# Patient Record
Sex: Male | Born: 1945
Health system: Southern US, Community
[De-identification: ages and names within clinical notes are randomized; demographics above are authoritative.]

## PROBLEM LIST (undated history)

## (undated) DIAGNOSIS — N201 Calculus of ureter: Secondary | ICD-10-CM

## (undated) DIAGNOSIS — Z85828 Personal history of other malignant neoplasm of skin: Secondary | ICD-10-CM

## (undated) DIAGNOSIS — G629 Polyneuropathy, unspecified: Secondary | ICD-10-CM

## (undated) DIAGNOSIS — Z972 Presence of dental prosthetic device (complete) (partial): Secondary | ICD-10-CM

## (undated) DIAGNOSIS — Z951 Presence of aortocoronary bypass graft: Secondary | ICD-10-CM

## (undated) DIAGNOSIS — E119 Type 2 diabetes mellitus without complications: Secondary | ICD-10-CM

## (undated) DIAGNOSIS — Z87442 Personal history of urinary calculi: Secondary | ICD-10-CM

## (undated) DIAGNOSIS — R3915 Urgency of urination: Secondary | ICD-10-CM

## (undated) DIAGNOSIS — A498 Other bacterial infections of unspecified site: Secondary | ICD-10-CM

## (undated) DIAGNOSIS — I2510 Atherosclerotic heart disease of native coronary artery without angina pectoris: Secondary | ICD-10-CM

## (undated) HISTORY — PX: CARDIAC CATHETERIZATION: SHX172

## (undated) HISTORY — DX: Polyneuropathy, unspecified: G62.9

## (undated) HISTORY — PX: OTHER SURGICAL HISTORY: SHX169

---

## 1992-12-11 HISTORY — PX: KNEE ARTHROSCOPY: SUR90

## 1998-03-23 ENCOUNTER — Other Ambulatory Visit: Admission: RE | Admit: 1998-03-23 | Discharge: 1998-03-23 | Payer: Self-pay | Admitting: *Deleted

## 1999-04-05 ENCOUNTER — Other Ambulatory Visit: Admission: RE | Admit: 1999-04-05 | Discharge: 1999-04-05 | Payer: Self-pay | Admitting: Podiatry

## 2004-02-09 ENCOUNTER — Ambulatory Visit (HOSPITAL_COMMUNITY): Admission: RE | Admit: 2004-02-09 | Discharge: 2004-02-09 | Payer: Self-pay | Admitting: Family Medicine

## 2006-10-23 ENCOUNTER — Encounter (INDEPENDENT_AMBULATORY_CARE_PROVIDER_SITE_OTHER): Payer: Self-pay | Admitting: Specialist

## 2006-10-23 ENCOUNTER — Inpatient Hospital Stay (HOSPITAL_COMMUNITY): Admission: RE | Admit: 2006-10-23 | Discharge: 2006-10-24 | Payer: Self-pay | Admitting: Urology

## 2006-10-23 HISTORY — PX: OTHER SURGICAL HISTORY: SHX169

## 2007-09-16 ENCOUNTER — Observation Stay (HOSPITAL_COMMUNITY): Admission: AD | Admit: 2007-09-16 | Discharge: 2007-09-17 | Payer: Self-pay | Admitting: Urology

## 2009-07-02 ENCOUNTER — Ambulatory Visit (HOSPITAL_BASED_OUTPATIENT_CLINIC_OR_DEPARTMENT_OTHER): Admission: RE | Admit: 2009-07-02 | Discharge: 2009-07-02 | Payer: Self-pay | Admitting: Urology

## 2011-03-19 LAB — POCT I-STAT, CHEM 8
BUN: 19 mg/dL (ref 6–23)
Chloride: 104 mEq/L (ref 96–112)
Creatinine, Ser: 1.7 mg/dL — ABNORMAL HIGH (ref 0.4–1.5)
Sodium: 137 mEq/L (ref 135–145)
TCO2: 25 mmol/L (ref 0–100)

## 2011-03-19 LAB — GLUCOSE, CAPILLARY: Glucose-Capillary: 136 mg/dL — ABNORMAL HIGH (ref 70–99)

## 2011-04-25 NOTE — Op Note (Signed)
NAME:  [name], [name] NO.:  [id number]   MEDICAL RECORD NO.:  [id number]          PATIENT TYPE:  AMB   LOCATION:  DAY                          FACILITY:  [facility]   PHYSICIAN:  Bertram Millard. Dahlstedt, M.D.DATE OF BIRTH:  07-30-1946   DATE OF PROCEDURE:  09/16/2007  DATE OF DISCHARGE:                               OPERATIVE REPORT   PREOPERATIVE DIAGNOSIS:  Right ureteral stone.   POSTOPERATIVE DIAGNOSIS:  Right ureteral stone.   PRINCIPAL PROCEDURE:  Cysto, right retrograde ureteropyelogram, holmium  laser of right ureteral stone, right ureteroscopic stone extraction,  right double-J stent.   SURGEON:  Bertram Millard. Dahlstedt, M.D.   ANESTHESIA:  General LMA.   COMPLICATIONS:  None.   BRIEF HISTORY:  A 65 year old who presented to the office today with  significant right flank pain.  CT revealed significant extravasation of  urine around the kidney and right hydroureter.  Due to the patient's  pain and his other findings, it was recommended that he undergo right  ureteroscopic stone extraction with possible stent.  Risks and  complications of this procedure were discussed with the patient.  He  understands these and desires to proceed.   DESCRIPTION OF PROCEDURE:  The patient was administered preoperative IV  antibiotics and taken to the operating room where general anesthetic was  administered.  He was placed in dorsal lithotomy position.  Genitalia  and perineum were prepped and draped.  The 22-French panendoscope was  advanced into his bladder.  Prostatic urethra was open, status post TUR.  Right ureteral orifice was identified and cannulated with an open-end  catheter.  Retrograde revealed a large filling defect in the distal  ureter with proximal hydroureter.  The guidewire was advanced up into  the kidney.  The distal ureter was dilated to 15-French with ureteral  access catheter.  I then attempted to grasp the stone using the  ureteroscope and a nitinol  basket.  I was able to grasp it, but unable  to extract it due to its large size.  I then release a stone, and passed  the laser fiber up.  This stone was fragmented into multiple small  particles, mostly sand-like fragments.  The larger two or three  fragments were grasped and removed into the bladder.  At this point the  large majority of the stone was felt to have been fragmented and brought  in the bladder.  Over top of the guidewire, a 6-French x 24 cm contour  double-J stent was placed.  Good curls were seen proximally  and distally.  The string was left on and brought through the urethra  and taped to the penis.  Bladder was drained.  The scope removed.   The patient tolerated procedure well.  He was awakened, taken to PACU in  stable condition.      Bertram Millard. Dahlstedt, M.D.  Electronically Signed     SMD/MEDQ  D:  09/16/2007  T:  09/17/2007  Job:  [id number]

## 2011-04-25 NOTE — Op Note (Signed)
NAME:  [name], [name] NO.:  [id number]   MEDICAL RECORD NO.:  [id number]          PATIENT TYPE:  AMB   LOCATION:  [facility]                         FACILITY:  [facility]   PHYSICIAN:  Bertram Millard. Dahlstedt, M.D.DATE OF BIRTH:  1946-10-12   DATE OF PROCEDURE:  07/02/2009  DATE OF DISCHARGE:                               OPERATIVE REPORT   PREOPERATIVE DIAGNOSIS:  Large left ureteral stone.   POSTOPERATIVE DIAGNOSIS:  Large left ureteral stone.   PROCEDURE:  Cystoscopy, left ureteroscopy, holmium laser of the ureteral  stone, double-J stent placement.   SURGEON:  Bertram Millard. Dahlstedt, M.D.   ANESTHESIA:  General with LMA.   COMPLICATIONS:  None.   BRIEF HISTORY:  This 65 year old male presented earlier this week with  left flank pain.  He has a prior history of kidney stones and  intervention for this.  He was sent home on pain medicine, presented  today with 3 days of nausea, vomiting and significant pain.  He was made  pain free in the office, but KUB was performed showing a probable large  stone overlying his left sacral wing.  This was approximately 6 x 14 mm.  It was recommended to perform ureteroscopy and laser lithotripsy, due to  his pain and inability to keep food and fluids down.  Risks and  complications have been discussed with the patient.  He understands  these and agrees to proceed.   DESCRIPTION OF PROCEDURE:  The patient was identified in the holding  area, the surgical side marked, he received preoperative IV antibiotics.  He was taken to the operating room where general anesthetic was  administered using LMA.  He was placed in the dorsal lithotomy position.  Genitalia and perineum were prepped and draped.  A 22-French  panendoscope was advanced through his urethra into his bladder which was  found to be normal.  Left ureteral orifice was cannulated with a sensor  tip guidewire which was advanced using fluoroscopic guidance into the  left kidney.   The cystoscope was then removed with a guidewire left in  place.  A 6-French short ureteroscope was then passed under direct  vision up to the stone which was approximately 5 cm up the ureter.  This  was fairly large.  Pictures were that were obtained.  The 200 micron  laser fiber was advanced up to the stone, and the stone treated with the  holmium laser.  It was fragmented into hundreds of smaller fragments  which were felt to be easily able to be passed.  None were extracted.  The largest of these fragments was probably 1-2 mm.  The ureter was  passively dilated with the scope.  I then remove the ureteroscope after  I passed the ureteroscope proximally up the ureter were no stones were  seen.  Over the cystoscope a 24 cm x 6-French Contour stent was placed  in the left ureter using fluoroscopic and cystoscopic guidance.  Good  curls were seen proximally and distally.  The string was left intact.  The bladder was drained and the string brought through the urethra once  the scope was removed.   The patient tolerated procedure well.  He is discharged on Percocet and  Bactrim one p.o. daily for 6 days.  He will follow-up on the 29th of  this month.  He will remove the stent approximately 3 days.      Bertram Millard. Dahlstedt, M.D.  Electronically Signed     SMD/MEDQ  D:  07/02/2009  T:  07/03/2009  Job:  [id number]

## 2011-04-28 NOTE — Op Note (Signed)
NAME:  [name], [name] NO.:  [id number]   MEDICAL RECORD NO.:  [id number]          PATIENT TYPE:  INP   LOCATION:  0007                         FACILITY:  [facility]   PHYSICIAN:  Bertram Millard. Dahlstedt, M.D.DATE OF BIRTH:  [birth year]   DATE OF PROCEDURE:  10/23/2006  DATE OF DISCHARGE:                                 OPERATIVE REPORT   PREOPERATIVE DIAGNOSES:  1. Large bladder calculus.  2. Benign prostatic hypertrophy.   POSTOPERATIVE DIAGNOSES:  1. Large bladder calculus.  2. Benign prostatic hypertrophy.   PRINCIPAL PROCEDURE:  1. Cystolithalopaxy with holmium laser.  2. Transurethral incision of prostate.   SURGEON:  Bertram Millard. Dahlstedt, M.D.   ANESTHESIA:  General with LMA.   COMPLICATIONS:  None.   SPECIMEN:  Prostate chips.   BRIEF HISTORY:  This 65 year old gentleman presented about 2 weeks ago with  gross hematuria, usually after exercise.  He does have mild to moderate  lower urinary tract symptomatology.  He has a history of urolithiasis.  He  has seen Dr. Wanda Plump in the past for this.  The patient had a CT urogram  performed by Dr. Abigail Miyamoto which revealed a greater than 2 cm bladder calculus  as well as 2 stones in the right kidney, 1 small stone on the left.   The patient asked me not the scope him in the office, as he had significant  problems in the office with that before.  I reviewed the patient's CT scan.  This did reveal a 2.5 cm stone in his bladder.   It was recommended that we perform cystolithalopaxy, possible TURP versus  TUIP (as the patient does not or cannot obviously pass smaller stones).   Risks and complications of these procedures have been discussed with the  patient.  He understands these and desires to proceed.   DESCRIPTION OF PROCEDURE:  The patient received preoperative IV antibiotics.  He was taken to the operating room where general anesthetic was administered  using LMA.  He is placed in the dorsal lithotomy  position.  Genitalia and  perineum were prepped and draped.Marland Kitchen  Urethral meatus was calibrated at 30-  [place] with Sissy Hoff sound.  A 28-French resectoscope was then placed  transurethrally into the bladder.  The laser element was then placed as  well.  Inspection revealed some cobblestone-appearing mucosa posteriorly in  an area where the stone came in contact.  No definitive lesions were seen.  Prostate was mildly to moderately obstructed with a high-riding bladder  neck.  The stone was quite evident.  The large laser fiber was then passed  at a power of 1 joule. The stone was fragmented into multiple smaller stones  which were then easily evacuated from the bladder through the resectoscope  sheath.  Inspection of bladder revealed no remaining stones or fragments.   At this point, the resectoscope element was placed with a cutting loop.  TUIP was performed after the ureteral orifices were carefully identified.  The incision ran from the bladder neck/trigonal area to the verumontanum at  the 6 o'clock position.  This opened the prostate up  nicely.  Excellent  hemostasis was then obtained using the cautery.  Prostate chips were  evacuated from the bladder and sent for pathology.  Inspection of the  prostatic fossa revealed no bleeding.  At this point, the scope was removed,  and a 22-French 5 mL Foley catheter was placed and hooked to dependent  drainage.   The patient tolerated the procedure well.  He was awakened, extubated, taken  to PACU in stable condition.      Bertram Millard. Dahlstedt, M.D.  Electronically Signed     SMD/MEDQ  D:  10/23/2006  T:  10/23/2006  Job:  [id number]   cc:   Chales Salmon. Abigail Miyamoto, M.D.  Fax: [phone]

## 2011-04-28 NOTE — H&P (Signed)
NAME:  [name], [name] NO.:  [id number]   MEDICAL RECORD NO.:  [id number]          PATIENT TYPE:  INP   LOCATION:  0007                         FACILITY:  [facility]   PHYSICIAN:  Bertram Millard. Dahlstedt, M.D.DATE OF BIRTH:  [birth year]   DATE OF ADMISSION:  10/23/2006  DATE OF DISCHARGE:                                HISTORY & PHYSICAL   REASON FOR ADMISSION:  Bladder stone, large prostate.   BRIEF HISTORY:  A 65 year old male presents at this time for  cystolitholapaxy using laser and a TURP versus TUIP.  He presented to me in  October 2007, with post exercise gross hematuria.  He also had a moderate  lower urinary tract symptomatology with BPH symptom score of 20.   The patient had a CT urogram performed by Dr. Abigail Miyamoto, which revealed a  greater than 2-cm bladder calculus, 2 stones were also in the right kidney,  and 1 small one on the left.   As the patient had significant voiding symptoms and a large stone, it was  recommended that he undergo cystolitholapaxy with probable prostatic  procedure.  Risks and complications of these have been discussed with him at  length.  He understands these and desires to proceed.   PAST MEDICAL HISTORY:  1. Diabetes.  2. Gout.   PAST SURGICAL HISTORY:  Significant only for passing multiple stones.   MEDICATIONS:  1. Glucophage 500 mg daily.  2. Allopurinol 100 mg daily.   NO KNOWN DRUG ALLERGIES.   The patient is married.  He has two children.  He is a partially retired  Furniture conservator/restorer.  He drinks a beer day, denies tobacco use.   FAMILY HISTORY:  Significant for kidney stones heart failure, and diabetes.   REVIEW OF SYSTEMS:  Significant for some dysuria, recurrent gross hematuria  occurring after exercise.  He has some chills and dizziness at times.   PHYSICAL EXAMINATION:  GENERAL:  On admission revealed a pleasant middle-  aged male.  VITAL SIGNS:  Blood pressure 143/84, pulse 73, temperature 97,  respiratory  rate 18.  NECK:  Supple without thyromegaly or adenopathy.  CHEST:  Clear.  HEART:  Normal rate and rhythm.  ABDOMEN:  Flat, soft, nondistended, nontender.  No mass, no organomegaly.  Bladder not palpable.  No inguinal hernias.  No CVA tenderness or flank  mass.  GENITOURINARY:  Phallus is circumcised.  No lesions, no fibrotic areas or  plaques.  Glans normal, meatus normal in size without location size.  RECTAL:  Normal anal sphincter tone.  Gland 2+ symmetrical, non-nodular, and  nontender.  No rectal mass.  Seminal vesicles non-palpable.   Urinalysis was clear on admission.   Hemogram was normal.  B-MET was normal except for a glucose 218.   EKG was unremarkable except for sinus bradycardia.   IMPRESSION:  1. Large bladder calculus.  2. Bladder outlet obstruction.   PLAN:  1. Will admit for operative procedure.  2. Cystolitholapaxy and probable TUIP versus TURP.      Bertram Millard. Dahlstedt, M.D.  Electronically Signed     SMD/MEDQ  D:  10/23/2006  T:  10/23/2006  Job:  [id number]   cc:   Chales Salmon. Abigail Miyamoto, M.D.  Fax: [phone]

## 2011-06-23 ENCOUNTER — Ambulatory Visit (HOSPITAL_BASED_OUTPATIENT_CLINIC_OR_DEPARTMENT_OTHER)
Admission: RE | Admit: 2011-06-23 | Discharge: 2011-06-23 | Disposition: A | Discharge status: Home / Self Care | Payer: BC Managed Care – PPO | Source: Ambulatory Visit | Attending: Urology | Admitting: Urology

## 2011-06-23 DIAGNOSIS — Z01810 Encounter for preprocedural cardiovascular examination: Secondary | ICD-10-CM | POA: Insufficient documentation

## 2011-06-23 DIAGNOSIS — R109 Unspecified abdominal pain: Secondary | ICD-10-CM | POA: Insufficient documentation

## 2011-06-23 DIAGNOSIS — N201 Calculus of ureter: Secondary | ICD-10-CM | POA: Insufficient documentation

## 2011-06-23 DIAGNOSIS — Z01812 Encounter for preprocedural laboratory examination: Secondary | ICD-10-CM | POA: Insufficient documentation

## 2011-06-23 DIAGNOSIS — R310 Gross hematuria: Secondary | ICD-10-CM | POA: Insufficient documentation

## 2011-06-23 DIAGNOSIS — R9431 Abnormal electrocardiogram [ECG] [EKG]: Secondary | ICD-10-CM | POA: Insufficient documentation

## 2011-06-23 LAB — POCT I-STAT 4, (NA,K, GLUC, HGB,HCT)
Glucose, Bld: 140 mg/dL — ABNORMAL HIGH (ref 70–99)
HCT: 45 % (ref 39.0–52.0)
Potassium: 4.1 mEq/L (ref 3.5–5.1)
Sodium: 142 mEq/L (ref 135–145)

## 2011-07-11 NOTE — Op Note (Signed)
NAME:  DELFIN, [name] NO.:  [id number]  MEDICAL RECORD NO.:  [id number]  LOCATION:                                 FACILITY:  PHYSICIAN:  Bertram Millard. Mckinna Demars, M.D.DATE OF BIRTH:  02-May-1946  DATE OF PROCEDURE:  06/23/2011 DATE OF DISCHARGE:                              OPERATIVE REPORT   PREOPERATIVE DIAGNOSES:  Left distal ureteral stone and right proximal ureteral stone.  POSTOPERATIVE DIAGNOSES:  Left distal ureteral stone and right proximal ureteral stone.  SURGICAL PROCEDURES:  Cystoscopy, bilateral retrograde ureteral pyelograms, bilateral ureteroscopy with holmium laser, extraction of bilateral ureteral stones, double-J stent placement bilaterally (24 cm x 6-French Contour with string)  SURGEON:  Bertram Millard. Rochell Mabie, MD  ANESTHESIA:  General with LMA.  COMPLICATIONS:  None.  BRIEF HISTORY:  65 year old male recently presenting with intermittent gross hematuria and flank pain.  He was found to have bilateral ureteral stones, about a 6 mm stone proximally in the right and 8 mm stone distally on the left.  Because of his persistent symptoms over a significant length of time, it was recommended that he undergo a solitary procedure for these.  Risks and complications of ureteroscopy and laser of stones were discussed at length with the patient.  He understands these and desires to proceed.  DESCRIPTION OF PROCEDURE:  The patient was identified in the holding area.  He received preoperative IV antibiotics.  He was taken to the operating room where general anesthetic was administered using the LMA. He was placed in the dorsal lithotomy position.  Genitalia and perineum were prepped and draped.  Time-out was then performed.  The procedure then commenced.  Cystoscopy revealed the patient had a normal urethra, no prostatic obstruction and a normal bladder. Bilateral retrograde ureteral pyelograms were performed.  On the left, there was a filling  defect distally to approximately 2 cm up from the ureterovesical junction.  There was no significant hydronephrosis above this and the ureter.  Pyelocaliceal system was normal.  On the right, the entire distal ureter was normal, the mid ureter was normal.  There was a filling defect in the proximal ureter with significant right pyelocaliectasis.  A guidewire was advanced up in the left ureteral orifice.  The ureteroscope was then passed and the 8 mm stone fragmented into multiple fragments, which were then removed with a basket into the bladder.  After this, double-J stent was placed over the guidewire.  On the right, the ureteroscope was advanced in the renal pelvis where the stone was seen lying posteriorly.  It was grasped and brought into the proximal ureter.  I could not remove it any farther, so it was then placed back in the pelvis and fragmented with the holmium laser.  Three to four fragments were left.  I removed at least 2 of these fragments. Following this, the pelvis became cloudy with a mild amount of bloody irrigation.  I was unable to locate the other 2 small fragments.  These were left in situ and it was felt that they would pass with the stent being left in.  A 6-French 24 cm stent was placed in the right ureter, with good proximal and distal  curl seen on that side as well as contralaterally.  The strings were left on the stent, the bladder was drained and the scope removed. The stent strings were tied to the penis.  At this point, the procedure was terminated.  The patient was awakened and then taken to PACU in stable condition.  He tolerated the procedure well.     Bertram Millard. Wanisha Shiroma, M.D.     SMD/MEDQ  D:  06/23/2011  T:  06/23/2011  Job:  [id number]  Electronically Signed by Marcine Matar M.D. on 07/11/2011 04:57:04 PM

## 2011-09-21 LAB — URINALYSIS, ROUTINE W REFLEX MICROSCOPIC
Glucose, UA: NEGATIVE
Ketones, ur: NEGATIVE
Leukocytes, UA: NEGATIVE
Protein, ur: NEGATIVE
pH: 5.5

## 2011-09-21 LAB — BASIC METABOLIC PANEL
BUN: 19
CO2: 24
Chloride: 107
GFR calc non Af Amer: 44 — ABNORMAL LOW
Glucose, Bld: 114 — ABNORMAL HIGH
Potassium: 3.9
Sodium: 140

## 2012-04-10 HISTORY — PX: CARDIOVASCULAR STRESS TEST: SHX262

## 2012-04-12 ENCOUNTER — Other Ambulatory Visit: Payer: Self-pay | Admitting: Cardiology

## 2012-04-12 ENCOUNTER — Encounter (HOSPITAL_COMMUNITY): Payer: Self-pay | Admitting: Pharmacy Technician

## 2012-04-15 ENCOUNTER — Other Ambulatory Visit: Payer: Self-pay | Admitting: Cardiology

## 2012-04-15 MED ORDER — SODIUM CHLORIDE 0.9 % IV SOLN: INTRAVENOUS | Status: AC

## 2012-04-15 NOTE — H&P (Signed)
Subjective:     CC:    1. MS/stress test f/u.        HPI:  General:  65-year-old with diabetes, gout here for evaluation of anginal-like symptoms. He's been describing exertional discomfort. His EKG performed on 03/25/12 was personally reviewed. This shows sinus rhythm rate 61 with T wave inversion in the inferior leads, possible ischemic changes, possible nonspecific abnormality. Aspirin 81 mg was started. In regard to diabetes, hemoglobin A1c 6.7. His blood pressure has been well controlled. He also has neuropathy. Erectile dysfunction. He enjoys running but his exercise endurance has decreased. After about 6 minutes he ends up with a burning sensation in his throat that he has to stop, resolved quickly, returns with activity. Noted this in November as well. Runs at [employer]. Stays tired. Feels SOB with activity. Daughter in drama school in [place].LDL cholesterol was 123 in September of 2012.  NUC STRESS: Abnormal stress test, inferior/inferoseptal/apical/distal anteroseptal wall ischemia. Normal ejection fraction 54%. Abnormal exercise treadmill test with ST segment depressions diffusely. Cardiac catheterization recommended. We discussed at length.  .        ROS:  No bleeding, no syncope, no rash. Increased fatigue. , The other elements of the review of systems are negative.       Medical History: AODM, Gout.        Family History: Father: deceased Etoh, Mother: deceased 82 yrs Possible CHF  Mother's brothers had MI in their 50's.       Social History:  General:  History of smoking cigarettes: Never smoked.  no Smoking.  Alcohol: no.  no Recreational drug use.  Diet: not so good this fall.  Exercise: 5 x week, jog/run.        Medications: Metformin HCl 500 MG Tablet Extended Release 24 Hour 2 tabs once a day, Allopurinol 100 MG Tablet 1 tablet once a day, Aspirin 81 MG Tablet Chewable 1 tablet Once a day, Atorvastatin Calcium 20 MG Tablet 1 tablet Once a day, Medication List  reviewed and reconciled with the patient       Allergies: N.K.D.A.      Objective:     Vitals: Wt 194, Wt change -1 lb, Ht 68.75, BMI 28.85, Pulse sitting 68, BP sitting 150/98.       Examination:  General Examination:  GENERAL APPEARANCE alert, oriented, NAD, pleasant.  SKIN: normal, no rash.  HEENT: normal.  HEAD: [place]/AT.  EYES: EOMI, Conjunctiva clear.  NECK: supple, FROM, without evidence of thyromegaly, adenopathy, or bruits, no jugular venous distention (JVD).  LUNGS: clear to auscultation bilaterally, no wheezes, rhonchi, rales, regular breathing rate and effort.  HEART: regular rate and rhythm, no S3, S4, soft systolic murmur right upper sternal border no rub, point of maximul impulse (PMI) normal.  ABDOMEN: soft, non-tender/non-distended, bowel sounds present, no masses palpated, no bruit.  EXTREMITIES: no clubbing, no edema, pulses 2 plus bilaterally.  NEUROLOGIC EXAM: non-focal exam, alert and oriented x 3.  PERIPHERAL PULSES: normal (2+) bilaterally.  LYMPH NODES: no cervical adenopathy.  PSYCH affect normal.  LDL 123 hemoglobin A1c 6.7, creatinine 1.1, HDL 54, prior medical records reviewed, EKG as above.       Assessment:     Assessment:  1. Abnormal cardiovascular function study - 794.30 (Primary)  2. Angina of effort - 413.9  3. Hypercholesteremia, pure - 272.0    Plan:     1. Abnormal cardiovascular function study  LAB: PT (Prothrombin Time) (005199) (Ordered for 04/12/2012)    Prothrombin Time 10.6   9.1-12.0 - SEC    INR 1.0 0.8-1.2 -     LAB: Basic Metabolic (Ordered for 04/12/2012)    GLUCOSE 110  70-99 - mg/dL H   BUN 21  6-26 - mg/dL    CREATININE 1.27  0.60-1.30 - mg/dl    eGFR (NON-AFRICAN AMERICAN) 57 >60 - calc L   eGFR (AFRICAN AMERICAN) 69 >60 - calc    SODIUM 140  136-145 - mmol/L    POTASSIUM 4.8  3.5-5.5 - mmol/L    CHLORIDE 104  98-107 - mmol/L    C02 29  22-32 - mg/dL    ANION GAP 12.0 6.0-20.0 - mmol/L    CALCIUM 10.3   8.6-10.3 - mg/dL     LAB: CBC without Diff (Ordered for 04/12/2012)    WBC 7.7 4.0-11.0 - K/ul    RBC 4.94 4.20-5.80 - M/uL    HCT 47.0 39.0-52.0 - %    HGB 15.9 13.0-17.0 - g/dL    MCV 95.1 80.0-94.0 - fL H   MCH 32.1 27.0-33.0 - pg    MCHC 33.8 32.0-36.0 - g/dL    RDW 12.9 11.5-15.5 - %    PLT 195 150-400 - K/uL     LAB: CBC without Diff (Ordered for 04/12/2012)    WBC 7.7 4.0-11.0 - K/ul    RBC 4.94 4.20-5.80 - M/uL    HCT 47.0 39.0-52.0 - %    HGB 15.9 13.0-17.0 - g/dL    MCV 95.1 80.0-94.0 - fL H   MCH 32.1 27.0-33.0 - pg    MCHC 33.8 32.0-36.0 - g/dL    RDW 12.9 11.5-15.5 - %    PLT 195 150-400 - K/uL     LAB: PT (Prothrombin Time) (005199) (Ordered for 04/12/2012)    Prothrombin Time 10.6 9.1-12.0 - SEC    INR 1.0 0.8-1.2 -     LAB: Basic Metabolic (Ordered for 04/12/2012)    GLUCOSE 110  70-99 - mg/dL H   BUN 21  6-26 - mg/dL    CREATININE 1.27  0.60-1.30 - mg/dl    eGFR (NON-AFRICAN AMERICAN) 57 >60 - calc L   eGFR (AFRICAN AMERICAN) 69 >60 - calc    SODIUM 140  136-145 - mmol/L    POTASSIUM 4.8  3.5-5.5 - mmol/L    CHLORIDE 104  98-107 - mmol/L    C02 29  22-32 - mg/dL    ANION GAP 12.0 6.0-20.0 - mmol/L    CALCIUM 10.3  8.6-10.3 - mg/dL     Risks and benefits of cardiac catheterization have been reviewed including risk of stroke, heart attack, death, bleeding, renal impariment and arterial damage. There was ample oppurtuny to answer questions. Alternatives were discussed. Patient understands and wishes to proceed. Radial approach. Tues am.        Immunizations:        Labs:        Procedure Codes: 36415 BLOOD COLLECTION ROUTINE VENIPUNCTURE, 80048 ECL BMP, 85027 ECL CBC PLATELET       Preventive:         Follow Up: post cath      Provider: Malajah Oceguera, MD  Patient: [name] [name] DOB: 05/15/1946 Date: 04/12/2012    

## 2012-04-16 ENCOUNTER — Encounter (HOSPITAL_COMMUNITY): Payer: Self-pay | Admitting: General Practice

## 2012-04-16 ENCOUNTER — Encounter (HOSPITAL_COMMUNITY)
Admission: RE | Disposition: A | Discharge status: Home / Self Care | Payer: Self-pay | Source: Ambulatory Visit | Attending: Surgery

## 2012-04-16 ENCOUNTER — Other Ambulatory Visit: Payer: Self-pay | Admitting: Surgery

## 2012-04-16 ENCOUNTER — Inpatient Hospital Stay (HOSPITAL_COMMUNITY)
Admission: RE | Admit: 2012-04-16 | Discharge: 2012-04-23 | DRG: 107 | Disposition: A | Discharge status: Home / Self Care | Payer: BC Managed Care – PPO | Source: Ambulatory Visit | Attending: Surgery | Admitting: Surgery

## 2012-04-16 DIAGNOSIS — Z951 Presence of aortocoronary bypass graft: Secondary | ICD-10-CM

## 2012-04-16 DIAGNOSIS — D62 Acute posthemorrhagic anemia: Secondary | ICD-10-CM | POA: Diagnosis not present

## 2012-04-16 DIAGNOSIS — I2510 Atherosclerotic heart disease of native coronary artery without angina pectoris: Principal | ICD-10-CM | POA: Diagnosis present

## 2012-04-16 DIAGNOSIS — E109 Type 1 diabetes mellitus without complications: Secondary | ICD-10-CM

## 2012-04-16 DIAGNOSIS — I200 Unstable angina: Secondary | ICD-10-CM | POA: Diagnosis present

## 2012-04-16 DIAGNOSIS — I10 Essential (primary) hypertension: Secondary | ICD-10-CM | POA: Diagnosis present

## 2012-04-16 DIAGNOSIS — E785 Hyperlipidemia, unspecified: Secondary | ICD-10-CM | POA: Diagnosis present

## 2012-04-16 DIAGNOSIS — D696 Thrombocytopenia, unspecified: Secondary | ICD-10-CM | POA: Diagnosis not present

## 2012-04-16 DIAGNOSIS — E119 Type 2 diabetes mellitus without complications: Secondary | ICD-10-CM | POA: Diagnosis present

## 2012-04-16 DIAGNOSIS — E7800 Pure hypercholesterolemia, unspecified: Secondary | ICD-10-CM | POA: Diagnosis present

## 2012-04-16 DIAGNOSIS — Z79899 Other long term (current) drug therapy: Secondary | ICD-10-CM

## 2012-04-16 HISTORY — PX: LEFT HEART CATHETERIZATION WITH CORONARY ANGIOGRAM: SHX5451

## 2012-04-16 HISTORY — DX: Atherosclerotic heart disease of native coronary artery without angina pectoris: I25.10

## 2012-04-16 LAB — GLUCOSE, CAPILLARY
Glucose-Capillary: 121 mg/dL — ABNORMAL HIGH (ref 70–99)
Glucose-Capillary: 186 mg/dL — ABNORMAL HIGH (ref 70–99)
Glucose-Capillary: 95 mg/dL (ref 70–99)
Glucose-Capillary: 166 mg/dL — ABNORMAL HIGH (ref 70–99)

## 2012-04-16 LAB — APTT: aPTT: 32 seconds (ref 24–37)

## 2012-04-16 SURGERY — LEFT HEART CATHETERIZATION WITH CORONARY ANGIOGRAM
Anesthesia: LOCAL

## 2012-04-16 MED ORDER — SODIUM CHLORIDE 0.9 % IJ SOLN: 3.0000 mL | INTRAMUSCULAR | Status: DC | PRN

## 2012-04-16 MED ORDER — ONDANSETRON HCL 4 MG/2ML IJ SOLN: 4.0000 mg | Freq: Four times a day (QID) | INTRAMUSCULAR | Status: DC | PRN

## 2012-04-16 MED ORDER — ACETAMINOPHEN 325 MG PO TABS: 650.0000 mg | ORAL_TABLET | ORAL | Status: DC | PRN

## 2012-04-16 MED ORDER — HEPARIN (PORCINE) IN NACL 100-0.45 UNIT/ML-% IJ SOLN
14.0000 [IU]/kg/h | INTRAMUSCULAR | Status: DC
  Administered 2012-04-16 – 2012-04-17 (×2): 14 [IU]/kg/h via INTRAVENOUS
  Filled 2012-04-16 (×2): qty 250

## 2012-04-16 MED ORDER — ~~LOC~~ CARDIAC SURGERY, PATIENT & FAMILY EDUCATION
Freq: Once | Status: AC
  Administered 2012-04-17: 09:00:00
  Filled 2012-04-16: qty 1

## 2012-04-16 MED ORDER — ASPIRIN EC 81 MG PO TBEC
81.0000 mg | DELAYED_RELEASE_TABLET | Freq: Every day | ORAL | Status: DC
  Administered 2012-04-17 – 2012-04-18 (×2): 81 mg via ORAL
  Filled 2012-04-16 (×4): qty 1

## 2012-04-16 MED ORDER — ATORVASTATIN CALCIUM 40 MG PO TABS
40.0000 mg | ORAL_TABLET | Freq: Every day | ORAL | Status: DC
  Administered 2012-04-16 – 2012-04-22 (×6): 40 mg via ORAL
  Filled 2012-04-16 (×9): qty 1

## 2012-04-16 MED ORDER — SODIUM CHLORIDE 0.9 % IV SOLN: 1.0000 mL/kg/h | INTRAVENOUS | Status: AC

## 2012-04-16 MED ORDER — LIDOCAINE HCL (PF) 1 % IJ SOLN
INTRAMUSCULAR | Status: AC
  Filled 2012-04-16: qty 30

## 2012-04-16 MED ORDER — ASPIRIN 81 MG PO CHEW
324.0000 mg | CHEWABLE_TABLET | ORAL | Status: AC
  Administered 2012-04-16: 324 mg via ORAL
  Filled 2012-04-16: qty 4

## 2012-04-16 MED ORDER — MIDAZOLAM HCL 2 MG/2ML IJ SOLN
INTRAMUSCULAR | Status: AC
  Filled 2012-04-16: qty 2

## 2012-04-16 MED ORDER — HEPARIN (PORCINE) IN NACL 2-0.9 UNIT/ML-% IJ SOLN
INTRAMUSCULAR | Status: AC
  Filled 2012-04-16: qty 2000

## 2012-04-16 MED ORDER — NITROGLYCERIN 0.2 MG/ML ON CALL CATH LAB
INTRAVENOUS | Status: AC
  Filled 2012-04-16: qty 1

## 2012-04-16 MED ORDER — DIAZEPAM 5 MG PO TABS
5.0000 mg | ORAL_TABLET | ORAL | Status: AC
Start: 2012-04-16 — End: 2012-04-16
  Administered 2012-04-16: 5 mg via ORAL
  Filled 2012-04-16: qty 1

## 2012-04-16 MED ORDER — OXYCODONE-ACETAMINOPHEN 5-325 MG PO TABS: 1.0000 | ORAL_TABLET | ORAL | Status: DC | PRN

## 2012-04-16 MED ORDER — FENTANYL CITRATE 0.05 MG/ML IJ SOLN
INTRAMUSCULAR | Status: AC
  Filled 2012-04-16: qty 2

## 2012-04-16 MED ORDER — SODIUM CHLORIDE 0.9 % IV SOLN
250.0000 mL | INTRAVENOUS | Status: DC | PRN
  Administered 2012-04-16: 1000 mL via INTRAVENOUS

## 2012-04-16 MED ORDER — ALLOPURINOL 100 MG PO TABS
100.0000 mg | ORAL_TABLET | Freq: Every day | ORAL | Status: DC
  Administered 2012-04-16 – 2012-04-23 (×7): 100 mg via ORAL
  Filled 2012-04-16 (×8): qty 1

## 2012-04-16 MED ORDER — SODIUM CHLORIDE 0.9 % IJ SOLN: 3.0000 mL | Freq: Two times a day (BID) | INTRAMUSCULAR | Status: DC

## 2012-04-16 MED ORDER — ASPIRIN 81 MG PO CHEW: 81.0000 mg | CHEWABLE_TABLET | Freq: Every day | ORAL | Status: DC

## 2012-04-16 NOTE — Consult Note (Signed)
[address]            [name] [address]          [phone]      Reason for Consult: Severe 3-vessel coronary artery disease Referring Physician: Donato Schultz, MD  [name] is an 66 y.o. male.  HPI:   Patient has a several month history of exertional burning in throat associated with fatigue, dyspnea. He is a very active runner but has been getting symptoms within 6 minutes of starting a run, relieved with rest. Nuclear stress test showed EF of 54% with diffuse ST segment depression with exercise. Cath today shows severe multivessel CAD as noted below.   Past Medical History  Diagnosis Date  . Coronary artery disease 04/2012  . Angina   . Shortness of breath   . Pneumonia     hx of pna  . Diabetes mellitus     type 2  . Gout   . Kidney stones     Past Surgical History  Procedure Date  . Lithotripsy   . Cardiac catheterization 04/16/2012   . Cardiovascular stress test 04/2012  . Knee arthroscopy     History reviewed. No pertinent family history.  Social History:  reports that he has never smoked. He has never used smokeless tobacco. He reports that he drinks alcohol. He reports that he does not use illicit drugs.  Allergies: No Known Allergies  Medications:  I have reviewed the patient's current medications. Prior to Admission:  Prescriptions prior to admission  Medication Sig Dispense Refill  . allopurinol (ZYLOPRIM) 100 MG tablet Take 100 mg by mouth daily.      Marland Kitchen aspirin EC 81 MG tablet Take 81 mg by mouth daily.      . metFORMIN (GLUCOPHAGE) 500 MG tablet Take 1,000 mg by mouth every evening.       Scheduled:   . allopurinol  100 mg Oral Daily  . aspirin  324 mg Oral Pre-Cath  . aspirin  81 mg Oral Daily  . aspirin EC  81 mg Oral Daily  . atorvastatin  40 mg Oral q1800  . diazepam  5 mg Oral On Call  . fentaNYL      . heparin      . lidocaine      . midazolam      . nitroGLYCERIN      . DISCONTD: sodium  chloride  3 mL Intravenous Q12H   Continuous:   . sodium chloride 1 mL/kg/hr (04/16/12 0830)  . heparin     BJY:[id number], ondansetron (ZOFRAN) IV, oxyCODONE-acetaminophen, DISCONTD: sodium chloride, DISCONTD: sodium chloride  Results for orders placed during the hospital encounter of 04/16/12 (from the past 48 hour(s))  GLUCOSE, CAPILLARY     Status: Abnormal   Collection Time   04/16/12  8:41 AM      Component Value Range Comment   Glucose-Capillary 121 (*) 70 - 99 (mg/dL)   PROTIME-INR     Status: Normal   Collection Time   04/16/12 11:48 AM      Component Value Range Comment   Prothrombin Time 13.6  11.6 - 15.2 (seconds)    INR 1.02  0.00 - 1.49    APTT     Status: Normal   Collection Time   04/16/12 11:48 AM      Component Value Range Comment   aPTT 32  24 -  37 (seconds)   GLUCOSE, CAPILLARY     Status: Abnormal   Collection Time   04/16/12 12:35 PM      Component Value Range Comment   Glucose-Capillary 186 (*) 70 - 99 (mg/dL)   GLUCOSE, CAPILLARY     Status: Normal   Collection Time   04/16/12  5:41 PM      Component Value Range Comment   Glucose-Capillary 95  70 - 99 (mg/dL)     No results found.  Review of Systems  Constitutional: Positive for malaise/fatigue and diaphoresis.  HENT: Negative.   Eyes: Negative.   Respiratory: Positive for shortness of breath. Negative for cough, hemoptysis and sputum production.   Cardiovascular: Negative for chest pain, palpitations, orthopnea, claudication, leg swelling and PND.  Gastrointestinal: Negative.   Genitourinary: Negative for dysuria, urgency, frequency, hematuria and flank pain.  Musculoskeletal: Negative.   Skin: Negative.   Neurological: Negative.   Endo/Heme/Allergies: Negative.   Psychiatric/Behavioral: Negative.    Blood pressure 134/72, pulse 59, temperature 97.7 F (36.5 C), temperature source Oral, resp. rate 18, height 5\' 8"  (1.727 m), weight 86.183 kg (190 lb), SpO2 97.00%. Physical Exam    Constitutional: He is oriented to person, place, and time. He appears well-developed and well-nourished. No distress.  HENT:  Head: Normocephalic and atraumatic.  Eyes: EOM are normal. Pupils are equal, round, and reactive to light.  Neck: Normal range of motion. Neck supple. No JVD present. No thyromegaly present.  Cardiovascular: Normal rate, regular rhythm and intact distal pulses.  Exam reveals no gallop and no friction rub.   No murmur heard. Respiratory: Effort normal and breath sounds normal. No respiratory distress.  GI: Soft. Bowel sounds are normal. He exhibits no distension and no mass. There is no tenderness.  Musculoskeletal: He exhibits no edema and no tenderness.  Lymphadenopathy:    He has no cervical adenopathy.  Neurological: He is oriented to person, place, and time. He has normal strength. A cranial nerve deficit is present. No sensory deficit.  Skin: Skin is warm and dry.  Psychiatric: He has a normal mood and affect.    Cardiac Cath:  HEMODYNAMICS:  Aortic pressure was  108/60mmHg; LV systolic pressure was  ; LVEDP  .  There was no gradient between the left ventricle and aorta.    ANGIOGRAPHIC DATA:    Left main: Minor luminal irregularities of the left main artery, branches into the LAD and circumflex.   Left anterior descending (LAD): Proximal LAD stenosis of 90%. 2 small sized diagonal vessels. The remainder of the LAD has minor luminal irregularities. The 90% stenosis proximally is at the bifurcation of the first septal branch and diagonal branch.   Circumflex artery (CIRC): The AV groove circumflex is 100% occluded in the midsegment with left to right collaterals retrograde filling the vessel. The second obtuse marginal branch is small in caliber, sluggish flow. The first, large obtuse marginal branch has minor luminal irregularities and encompasses a large portion of the lateral wall distribution.   Right coronary artery (RCA): Proximal 95%  stenosis as well as distal 60% stenosis before the bifurcation of the PDA. This is the dominant vessel.   LEFT VENTRICULOGRAM:  Left ventricular angiogram was done in the 30 RAO projection and revealed normal left ventricular wall motion and systolic function with an estimated ejection fraction of 55%.   IMPRESSIONS/ RECOMMENDATION:    1. Severe three-vessel coronary artery disease, proximal 90% LAD stenosis, proximal 95% RCA stenosis, occluded circumflex.  2. Normal left  ventricular ejection fraction. Mildly elevated left ventricular end-diastolic pressure consistent with diastolic dysfunction.  3. TCTS consultation for bypass surgery. I will admit him and his increasing symptoms, severe coronary artery disease that could possibly lead to sudden death if he were discharged. I will place him on IV heparin 8 hours after sheath pull.      Assessment/Plan:  Severe multi-vessel coronary disease with progressive exertional angina. I agree that CABG is the best option for treatment for this diabetic, active gentleman. I discussed the operative procedure with the patient and family including alternatives, benefits and risks; including but not limited to bleeding, blood transfusion, infection, stroke, myocardial infarction, graft failure, heart block requiring a permanent pacemaker, organ dysfunction, and death.  [name] understands and agrees to proceed.  We will schedule surgery for Friday am.  Alleen Borne 04/16/2012, 6:18 PM

## 2012-04-16 NOTE — Interval H&P Note (Signed)
History and Physical Interval Note:  04/16/2012 8:19 AM  [name]  has presented today for surgery, with the diagnosis of R/O CAD  The various methods of treatment have been discussed with the patient and family. After consideration of risks, benefits and other options for treatment, the patient has consented to  Procedure(s) (LRB): LEFT HEART CATHETERIZATION WITH CORONARY ANGIOGRAM (N/A) as a surgical intervention .  The patients' history has been reviewed, patient examined, no change in status, stable for surgery.  I have reviewed the patients' chart and labs.  Questions were answered to the patient's satisfaction.     Dawid Dupriest

## 2012-04-16 NOTE — Consult Note (Signed)
ANTICOAGULATION CONSULT NOTE - Initial Consult  Pharmacy Consult for : Heparin Indication: ACS/STEMI  No Known Allergies  Patient Measurements: Height: 5\' 8"  (172.7 cm) Weight: 190 lb (86.183 kg) IBW/kg (Calculated) : 68.4   Vital Signs: Temp: 97.1 F (36.2 C) (05/07 0900) Temp src: Oral (05/07 0900) BP: 121/72 mmHg (05/07 0900) Pulse Rate: 56  (05/07 0900)  Labs: No results found for this basename: HGB:2,HCT:3,PLT:3,APTT:3,LABPROT:3,INR:3,HEPARINUNFRC:3,CREATININE:3,CKTOTAL:3,CKMB:3,TROPONINI:3 in the last 72 hours Estimated Creatinine Clearance: 47.5 ml/min (by C-G formula based on Cr of 1.7).  Medical / Surgical History: No past surgical history on file. Past medical history include DM, HTN, HLD.  Medications:  Prescriptions prior to admission  Medication Sig Dispense Refill  . allopurinol (ZYLOPRIM) 100 MG tablet Take 100 mg by mouth daily.      Marland Kitchen aspirin EC 81 MG tablet Take 81 mg by mouth daily.      . metFORMIN (GLUCOPHAGE) 500 MG tablet Take 1,000 mg by mouth every evening.       Scheduled:    . allopurinol  100 mg Oral Daily  . aspirin  324 mg Oral Pre-Cath  . aspirin  81 mg Oral Daily  . aspirin EC  81 mg Oral Daily  . atorvastatin  40 mg Oral q1800  . diazepam  5 mg Oral On Call  . fentaNYL      . heparin      . lidocaine      . midazolam      . nitroGLYCERIN      . DISCONTD: sodium chloride  3 mL Intravenous Q12H   Infusions:    . sodium chloride 1 mL/kg/hr (04/16/12 0830)   PRN: acetaminophen, ondansetron (ZOFRAN) IV, oxyCODONE-acetaminophen, DISCONTD: sodium chloride, DISCONTD: sodium chloride  Assessment:  66 year old male with diabetes, hypertension, hyperlipidemia with increasing frequency of anginal-like symptoms.   stress test evaluation abnormal w/exercise treadmill portion with diffuse ST segment depression and abnormal perfusion.  Results of cath show severe 3-vessel CAD, normal LV EF.  TCTS will be consulted for bypass  surgery.  Heparin infusion to be started 8 hours after sheath pull.   Goal of Therapy:  Heparin level 0.3-0.7 units/ml   Plan:  Baseline INR, CBC.   Begin Heparin , no load, at 14 units/kg/hour-1200 units/hr.  Follow up Heparin level, CBC in 6 hours, then daily.  Shareece Bultman, Elisha Headland, Pharm.D. 04/16/2012 10:29 AM

## 2012-04-16 NOTE — CV Procedure (Signed)
PROCEDURE:  Left heart catheterization with selective coronary angiography, left ventriculogram via the radial artery approach.  INDICATIONS:  66 year old male with diabetes, hypertension, hyperlipidemia who has experiencing increasing frequency of anginal-like symptoms, increasing fatigue especially when running into underwent stress test evaluation which showed abnormal exercise treadmill portion with diffuse ST segment depression as well as abnormality in perfusion along the apical, inferior, anteroseptal/inferoseptal wall. He is here for diagnostic cardiac catheterization.   The risks, benefits, and details of the procedure were explained to the patient, including possibilities of stroke, heart attack, death, renal impairment, arterial damage, bleeding.  The patient verbalized understanding and wanted to proceed.  Informed written consent was obtained.  PROCEDURE TECHNIQUE:  Allen's test was performed pre-and post procedure and was normal. The right radial artery site was prepped and draped in a sterile fashion. One percent lidocaine was used for local anesthesia.  ultrasound guidance was utilized after there was difficulty in advancing the sheath wire. There was a 90 left turn in the radial artery at the bifurcation with the brachial. The Versicor was selecting a small branch. 2 separate angiograms of the radial/brachial artery system were performed. I was then able to subselect be larger brachial artery and the remainder of the procedure went without difficulty.  Using the modified Seldinger technique a 5 French hydrophilic sheath was inserted into the radial artery without difficulty. 3 mg of verapamil was administered via the sheath. A Judkins right #4 catheter with the guidance of a Versicore wire was placed in the right coronary cusp and selectively cannulated the right coronary artery. After traversing the aortic arch, 4300  units of heparin IV was administered. A Judkins left #3.5 catheter was  used to selectively cannulate the left main artery. Multiple views with hand injection of Omnipaque were obtained. Catheter a pigtail catheter was used to cross into the left ventricle, hemodynamics were obtained, and a left ventriculogram was performed in the RAO position with power injection. Following the procedure, sheath was removed, patient was hemodynamically stable, hemostasis was maintained with a Terumo T band.   CONTRAST:  Total of 110 ml.    FLOUROSCOPY TIME: 5.1 minutes.  COMPLICATIONS:  None.    HEMODYNAMICS:  Aortic pressure was  108/75mmHg; LV systolic pressure was  ; LVEDP  .  There was no gradient between the left ventricle and aorta.    ANGIOGRAPHIC DATA:    Left main: Minor luminal irregularities of the left main artery, branches into the LAD and circumflex.   Left anterior descending (LAD): Proximal LAD stenosis of 90%. 2 small sized diagonal vessels. The remainder of the LAD has minor luminal irregularities. The 90% stenosis proximally is at the bifurcation of the first septal branch and diagonal branch.   Circumflex artery (CIRC): The AV groove circumflex is 100% occluded in the midsegment with left to right collaterals retrograde filling the vessel. The second obtuse marginal branch is small in caliber, sluggish flow. The first, large obtuse marginal branch has minor luminal irregularities and encompasses a large portion of the lateral wall distribution.   Right coronary artery (RCA): Proximal 95% stenosis as well as distal 60% stenosis before the bifurcation of the PDA. This is the dominant vessel.   LEFT VENTRICULOGRAM:  Left ventricular angiogram was done in the 30 RAO projection and revealed normal left ventricular wall motion and systolic function with an estimated ejection fraction of 55%.   IMPRESSIONS/ RECOMMENDATION:    1. Severe three-vessel coronary artery disease, proximal 90% LAD stenosis, proximal 95% RCA  stenosis, occluded  circumflex.  2. Normal left ventricular ejection fraction. Mildly elevated left ventricular end-diastolic pressure consistent with diastolic dysfunction.  3. TCTS consultation for bypass surgery. I will admit him and his increasing symptoms, severe coronary artery disease that could possibly lead to sudden death if he were discharged. I will place him on IV heparin 8 hours after sheath pull.

## 2012-04-16 NOTE — H&P (View-Only) (Signed)
Subjective:     CC:    1. MS/stress test f/u.        HPI:  General:  66 year old with diabetes, gout here for evaluation of anginal-like symptoms. He's been describing exertional discomfort. His EKG performed on 03/25/12 was personally reviewed. This shows sinus rhythm rate 61 with T wave inversion in the inferior leads, possible ischemic changes, possible nonspecific abnormality. Aspirin 81 mg was started. In regard to diabetes, hemoglobin A1c 6.7. His blood pressure has been well controlled. He also has neuropathy. Erectile dysfunction. He enjoys running but his exercise endurance has decreased. After about 6 minutes he ends up with a burning sensation in his throat that he has to stop, resolved quickly, returns with activity. Noted this in November as well. Runs at [employer]. Stays tired. Feels SOB with activity. Daughter in drama school in [place].LDL cholesterol was 123 in September of 2012.  NUC STRESS: Abnormal stress test, inferior/inferoseptal/apical/distal anteroseptal wall ischemia. Normal ejection fraction 54%. Abnormal exercise treadmill test with ST segment depressions diffusely. Cardiac catheterization recommended. We discussed at length.  .        ROS:  No bleeding, no syncope, no rash. Increased fatigue. , The other elements of the review of systems are negative.       Medical History: AODM, Gout.        Family History: Father: deceased Etoh, Mother: deceased 15 yrs Possible CHF  Mother's brothers had MI in their 18's.       Social History:  General:  History of smoking cigarettes: Never smoked.  no Smoking.  Alcohol: no.  no Recreational drug use.  Diet: not so good this fall.  Exercise: 5 x week, jog/run.        Medications: Metformin HCl 500 MG Tablet Extended Release 24 Hour 2 tabs once a day, Allopurinol 100 MG Tablet 1 tablet once a day, Aspirin 81 MG Tablet Chewable 1 tablet Once a day, Atorvastatin Calcium 20 MG Tablet 1 tablet Once a day, Medication List  reviewed and reconciled with the patient       Allergies: N.K.D.A.      Objective:     Vitals: Wt 194, Wt change -1 lb, Ht 68.75, BMI 28.85, Pulse sitting 68, BP sitting 150/98.       Examination:  General Examination:  GENERAL APPEARANCE alert, oriented, NAD, pleasant.  SKIN: normal, no rash.  HEENT: normal.  HEAD: [place]/AT.  EYES: EOMI, Conjunctiva clear.  NECK: supple, FROM, without evidence of thyromegaly, adenopathy, or bruits, no jugular venous distention (JVD).  LUNGS: clear to auscultation bilaterally, no wheezes, rhonchi, rales, regular breathing rate and effort.  HEART: regular rate and rhythm, no S3, S4, soft systolic murmur right upper sternal border no rub, point of maximul impulse (PMI) normal.  ABDOMEN: soft, non-tender/non-distended, bowel sounds present, no masses palpated, no bruit.  EXTREMITIES: no clubbing, no edema, pulses 2 plus bilaterally.  NEUROLOGIC EXAM: non-focal exam, alert and oriented x 3.  PERIPHERAL PULSES: normal (2+) bilaterally.  LYMPH NODES: no cervical adenopathy.  PSYCH affect normal.  LDL 123 hemoglobin A1c 6.7, creatinine 1.1, HDL 54, prior medical records reviewed, EKG as above.       Assessment:     Assessment:  1. Abnormal cardiovascular function study - 794.30 (Primary)  2. Angina of effort - 413.9  3. Hypercholesteremia, pure - 272.0    Plan:     1. Abnormal cardiovascular function study  LAB: PT (Prothrombin Time) (045409) (Ordered for 04/12/2012)    Prothrombin Time 10.6  9.1-12.0 - SEC    INR 1.0 0.8-1.2 -     LAB: Basic Metabolic (Ordered for 04/12/2012)    GLUCOSE 110  70-99 - mg/dL H   BUN 21  2-13 - mg/dL    CREATININE 0.86  5.78-4.69 - mg/dl    eGFR (NON-AFRICAN AMERICAN) 57 >60 - calc L   eGFR (AFRICAN AMERICAN) 69 >60 - calc    SODIUM 140  136-145 - mmol/L    POTASSIUM 4.8  3.5-5.5 - mmol/L    CHLORIDE 104  98-107 - mmol/L    C02 29  22-32 - mg/dL    ANION GAP 62.9 5.2-84.1 - mmol/L    CALCIUM 10.3   8.6-10.3 - mg/dL     LAB: CBC without Diff (Ordered for 04/12/2012)    WBC 7.7 4.0-11.0 - K/ul    RBC 4.94 4.20-5.80 - M/uL    HCT 47.0 39.0-52.0 - %    HGB 15.9 13.0-17.0 - g/dL    MCV 32.4 40.1-02.7 - fL H   MCH 32.1 27.0-33.0 - pg    MCHC 33.8 32.0-36.0 - g/dL    RDW 25.3 66.4-40.3 - %    PLT 195 150-400 - K/uL     LAB: CBC without Diff (Ordered for 04/12/2012)    WBC 7.7 4.0-11.0 - K/ul    RBC 4.94 4.20-5.80 - M/uL    HCT 47.0 39.0-52.0 - %    HGB 15.9 13.0-17.0 - g/dL    MCV 47.4 25.9-56.3 - fL H   MCH 32.1 27.0-33.0 - pg    MCHC 33.8 32.0-36.0 - g/dL    RDW 87.5 64.3-32.9 - %    PLT 195 150-400 - K/uL     LAB: PT (Prothrombin Time) (518841) (Ordered for 04/12/2012)    Prothrombin Time 10.6 9.1-12.0 - SEC    INR 1.0 0.8-1.2 -     LAB: Basic Metabolic (Ordered for 04/12/2012)    GLUCOSE 110  70-99 - mg/dL H   BUN 21  6-60 - mg/dL    CREATININE 6.30  1.60-1.09 - mg/dl    eGFR (NON-AFRICAN AMERICAN) 57 >60 - calc L   eGFR (AFRICAN AMERICAN) 69 >60 - calc    SODIUM 140  136-145 - mmol/L    POTASSIUM 4.8  3.5-5.5 - mmol/L    CHLORIDE 104  98-107 - mmol/L    C02 29  22-32 - mg/dL    ANION GAP 32.3 5.5-73.2 - mmol/L    CALCIUM 10.3  8.6-10.3 - mg/dL     Risks and benefits of cardiac catheterization have been reviewed including risk of stroke, heart attack, death, bleeding, renal impariment and arterial damage. There was ample oppurtuny to answer questions. Alternatives were discussed. Patient understands and wishes to proceed. Radial approach. Tues am.        Immunizations:        Labs:        Procedure Codes: 20254 BLOOD COLLECTION ROUTINE VENIPUNCTURE, 80048 ECL BMP, 85027 ECL CBC PLATELET       Preventive:         Follow Up: post cath      Provider: Donato Schultz, MD  Patient: [name], [name] DOB: 21-Jul-1946 Date: 04/12/2012

## 2012-04-16 NOTE — Progress Notes (Signed)
TR BAND REMOVAL  LOCATION:    right radial  DEFLATED PER PROTOCOL:    yes  TIME BAND OFF / DRESSING APPLIED:    1030   SITE UPON ARRIVAL:    Level 0  SITE AFTER BAND REMOVAL:    Level 0  REVERSE ALLEN'S TEST:     positive  CIRCULATION SENSATION AND MOVEMENT:    Within Normal Limits   yes  COMMENTS:   Tolerated procedure well

## 2012-04-17 ENCOUNTER — Inpatient Hospital Stay (HOSPITAL_COMMUNITY): Payer: BC Managed Care – PPO

## 2012-04-17 DIAGNOSIS — Z01810 Encounter for preprocedural cardiovascular examination: Secondary | ICD-10-CM

## 2012-04-17 LAB — CBC
HCT: 42.1 % (ref 39.0–52.0)
Hemoglobin: 14.9 g/dL (ref 13.0–17.0)
Hemoglobin: 16.2 g/dL (ref 13.0–17.0)
MCH: 32.4 pg (ref 26.0–34.0)
MCV: 91.5 fL (ref 78.0–100.0)
MCV: 91 fL (ref 78.0–100.0)
Platelets: 192 10*3/uL (ref 150–400)
RBC: 5 MIL/uL (ref 4.22–5.81)
WBC: 7.9 10*3/uL (ref 4.0–10.5)
WBC: 8.4 10*3/uL (ref 4.0–10.5)

## 2012-04-17 LAB — GLUCOSE, CAPILLARY
Glucose-Capillary: 122 mg/dL — ABNORMAL HIGH (ref 70–99)
Glucose-Capillary: 129 mg/dL — ABNORMAL HIGH (ref 70–99)

## 2012-04-17 LAB — HEPARIN LEVEL (UNFRACTIONATED): Heparin Unfractionated: 0.44 IU/mL (ref 0.30–0.70)

## 2012-04-17 LAB — BASIC METABOLIC PANEL
BUN: 15 mg/dL (ref 6–23)
Chloride: 106 mEq/L (ref 96–112)
Creatinine, Ser: 1.1 mg/dL (ref 0.50–1.35)
Glucose, Bld: 100 mg/dL — ABNORMAL HIGH (ref 70–99)
Potassium: 3.9 mEq/L (ref 3.5–5.1)

## 2012-04-17 LAB — HEMOGLOBIN A1C: Hgb A1c MFr Bld: 7 % — ABNORMAL HIGH (ref <5.7)

## 2012-04-17 MED ORDER — HEPARIN (PORCINE) IN NACL 100-0.45 UNIT/ML-% IJ SOLN
1000.0000 [IU]/h | INTRAMUSCULAR | Status: DC
  Administered 2012-04-18 (×2): 1000 [IU]/h via INTRAVENOUS
  Filled 2012-04-17: qty 250

## 2012-04-17 MED FILL — Nicardipine HCl IV Soln 2.5 MG/ML: INTRAVENOUS | Qty: 1 | Status: AC

## 2012-04-17 NOTE — Progress Notes (Signed)
PFT completed. Unconfirmed copy placed in Shadow Chart. 

## 2012-04-17 NOTE — Progress Notes (Signed)
Inpatient Diabetes Program Recommendations  AACE/ADA: New Consensus Statement on Inpatient Glycemic Control  Target Ranges:  Prepandial:   less than 140 mg/dL      Peak postprandial:   less than 180 mg/dL (1-2 hours)      Critically ill patients:  140 - 180 mg/dL  Pager:  [phone] Hours:  8 am-10pm   Reason for Visit: History of Diabetes  Inpatient Diabetes Program Recommendations Correction (SSI): Consider adding Novolog Correction or at least order CBGs

## 2012-04-17 NOTE — Progress Notes (Signed)
Utilization Review Completed.  Yanet Balliet, [place] T  04/17/2012

## 2012-04-17 NOTE — Progress Notes (Signed)
VASCULAR LAB PRELIMINARY  PRELIMINARY  PRELIMINARY  PRELIMINARY  Pre-op Cardiac Surgery  Carotid Findings:  No evidence of internal carotid artery stenosis bilaterally. Bilateral antegrade vertebral artery flow.    Upper Extremity Right Left  Brachial Pressures 138-Triphasic 139-Triphasic  Radial Waveforms Monophasic Monophasic  Ulnar Waveforms Triphasic Monophasic  Palmar Arch (Allen's Test) Signal is unaffected with radial compression, obliterates with ulnar compression. Signal is unaffected with radial compression, obliterates with ulnar compression.      Lower  Extremity Right Left  Dorsalis Pedis 226-dampened monophasic 207-Monophasic  Anterior Tibial    Posterior Tibial 153-Monophasic 128-Monophasic  Ankle/Brachial Indices 1.63 1.49    Findings:  Right ABI=1.63, left ABI=1.49, which is indicative of calcified vessels bilaterally.   04/17/2012 11:20 AM [name], RDMS, RDCS

## 2012-04-17 NOTE — Progress Notes (Signed)
ANTICOAGULATION CONSULT NOTE - Follow Up Consult  Pharmacy Consult for heparin Indication: CAD awaiting CABG  No Known Allergies  Patient Measurements: Height: 5\' 8"  (172.7 cm) Weight: 194 lb 14.2 oz (88.4 kg) IBW/kg (Calculated) : 68.4  Heparin Dosing Weight: 86 kg  Vital Signs: Temp: 98.7 F (37.1 C) (05/08 1651) Temp src: Oral (05/08 1651) BP: 119/63 mmHg (05/08 1651) Pulse Rate: 60  (05/08 1651)  Labs:  Basename 04/17/12 1746 04/17/12 0046 04/16/12 1148  HGB 16.2 14.9 --  HCT 45.5 42.1 --  PLT 192 174 --  APTT -- -- 32  LABPROT -- -- 13.6  INR -- -- 1.02  HEPARINUNFRC 0.69 0.44 --  CREATININE -- 1.10 --  CKTOTAL -- -- --  CKMB -- -- --  TROPONINI -- -- --    Estimated Creatinine Clearance: 74.3 ml/min (by C-G formula based on Cr of 1.1).   Medications:  Scheduled:    . allopurinol  100 mg Oral Daily  . aspirin  81 mg Oral Daily  . aspirin EC  81 mg Oral Daily  . atorvastatin  40 mg Oral q1800  . going for heart surgery book   Does not apply Once   Infusions:    . heparin 14 Units/kg/hr (04/17/12 1602)    Assessment: 66 yo male with multi vessel CAD, awaiting CABG, confirmatory heparin level at upper end of goal.  No complications noted.  Goal of Therapy:  Heparin level 0.3-0.7 units/ml Monitor platelets by anticoagulation protocol: Yes   Plan:  Decrease heparin slightly to 1150 units/hr F/u daily heparin level and CBC  Elisa Sorlie L. Illene Bolus, PharmD, BCPS Clinical Pharmacist Pager: [phone] 04/17/2012 7:21 PM

## 2012-04-17 NOTE — Progress Notes (Signed)
Subjective:  He is feeling well, no complaints. He just got back from PFTs.  Objective:  Vital Signs in the last 24 hours: Temp:  [97.5 F (36.4 C)-98.1 F (36.7 C)] 97.5 F (36.4 C) (05/08 0700) Pulse Rate:  [51-62] 53  (05/08 0700) Resp:  [16-20] 18  (05/08 0700) BP: (107-138)/(64-75) 120/71 mmHg (05/08 0700) SpO2:  [93 %-97 %] 97 % (05/08 0700) Weight:  [88.4 kg (194 lb 14.2 oz)] 88.4 kg (194 lb 14.2 oz) (05/08 0655)  Intake/Output from previous day: 05/07 0701 - 05/08 0700 In: 1026.7 [P.O.:240; I.V.:786.7] Out: 950 [Urine:950]   Physical Exam: General: Well developed, well nourished, in no acute distress. Head:  Normocephalic and atraumatic. Lungs: Clear to auscultation and percussion. Heart: Normal S1 and S2.  No murmur, rubs or gallops.  Pulses: Pulses normal in all 4 extremities. Abdomen: soft, non-tender, positive bowel sounds. Extremities: No clubbing or cyanosis. No edema. Neurologic: Alert and oriented x 3.    Lab Results:  [facility] 04/17/12 0046  WBC 7.9  HGB 14.9  PLT 174    Basename 04/17/12 0046  NA 139  K 3.9  CL 106  CO2 25  GLUCOSE 100*  BUN 15  CREATININE 1.10     Telemetry: No adverse arrhythmias Personally viewed.   Assessment/Plan:  Principal Problem:  *Intermediate coronary syndrome Active Problems:  Coronary atherosclerosis of native coronary artery  Type II or unspecified type diabetes mellitus without mention of complication, not stated as uncontrolled  -Currently comfortable, IV heparin. Severe coronary artery disease. Awaiting bypass surgery on Friday. Undergoing presurgical workup.  - Had lengthy discussion with he and his wife. Counseling, teaching. We will continue to follow. Atorvastatin for hyperlipidemia. Aspirin   [name] 04/17/2012, 12:37 PM

## 2012-04-17 NOTE — Consult Note (Signed)
ANTICOAGULATION CONSULT NOTE   Pharmacy Consult for : Heparin Indication: CAD, awaiting CABG No Known Allergies  Patient Measurements: Height: 5\' 8"  (172.7 cm) Weight: 190 lb (86.183 kg) IBW/kg (Calculated) : 68.4   Vital Signs: Temp: 98 F (36.7 C) (05/07 2340) Temp src: Oral (05/07 2340) BP: 126/74 mmHg (05/07 2340) Pulse Rate: 53  (05/07 2340)  Labs:  Basename 04/17/12 0046 04/16/12 1148  HGB 14.9 --  HCT 42.1 --  PLT 174 --  APTT -- 32  LABPROT -- 13.6  INR -- 1.02  HEPARINUNFRC 0.44 --  CREATININE 1.10 --  CKTOTAL -- --  CKMB -- --  TROPONINI -- --   Estimated Creatinine Clearance: 73.4 ml/min (by C-G formula based on Cr of 1.1).  Assessment: 66 yo male with multi vessel CAD, awaiting CABG, for Heparin  Goal of Therapy:  Heparin level 0.3-0.7 units/ml   Plan: Continue Heparin at current rate   Eddie Candle 04/17/2012 1:50 AM

## 2012-04-18 ENCOUNTER — Inpatient Hospital Stay (HOSPITAL_COMMUNITY): Payer: BC Managed Care – PPO

## 2012-04-18 DIAGNOSIS — I2510 Atherosclerotic heart disease of native coronary artery without angina pectoris: Secondary | ICD-10-CM

## 2012-04-18 LAB — COMPREHENSIVE METABOLIC PANEL
Albumin: 4.3 g/dL (ref 3.5–5.2)
BUN: 16 mg/dL (ref 6–23)
Calcium: 9.7 mg/dL (ref 8.4–10.5)
Creatinine, Ser: 1.15 mg/dL (ref 0.50–1.35)
GFR calc Af Amer: 76 mL/min — ABNORMAL LOW (ref >90)
Glucose, Bld: 169 mg/dL — ABNORMAL HIGH (ref 70–99)
Total Protein: 6.8 g/dL (ref 6.0–8.3)

## 2012-04-18 LAB — HEPARIN LEVEL (UNFRACTIONATED): Heparin Unfractionated: 0.79 IU/mL — ABNORMAL HIGH (ref 0.30–0.70)

## 2012-04-18 LAB — BLOOD GAS, ARTERIAL
Bicarbonate: 24.4 mEq/L — ABNORMAL HIGH (ref 20.0–24.0)
Drawn by: 31101
FIO2: 0.21 %
O2 Saturation: 94 %
Patient temperature: 98.6
pH, Arterial: 7.424 (ref 7.350–7.450)
pO2, Arterial: 68.4 mmHg — ABNORMAL LOW (ref 80.0–100.0)

## 2012-04-18 LAB — CBC
HCT: 44.4 % (ref 39.0–52.0)
MCHC: 34.7 g/dL (ref 30.0–36.0)
RDW: 12.7 % (ref 11.5–15.5)
WBC: 6.4 10*3/uL (ref 4.0–10.5)

## 2012-04-18 LAB — TYPE AND SCREEN: ABO/RH(D): O POS

## 2012-04-18 LAB — GLUCOSE, CAPILLARY: Glucose-Capillary: 131 mg/dL — ABNORMAL HIGH (ref 70–99)

## 2012-04-18 LAB — ABO/RH: ABO/RH(D): O POS

## 2012-04-18 MED ORDER — DEXTROSE 5 % IV SOLN
1.5000 g | INTRAVENOUS | Status: AC
  Administered 2012-04-19: 1.5 g via INTRAVENOUS
  Administered 2012-04-19: .75 g via INTRAVENOUS
  Filled 2012-04-18: qty 1.5

## 2012-04-18 MED ORDER — TRANEXAMIC ACID (OHS) BOLUS VIA INFUSION
15.0000 mg/kg | INTRAVENOUS | Status: DC
  Filled 2012-04-18: qty 1323

## 2012-04-18 MED ORDER — SODIUM CHLORIDE 0.9 % IV SOLN
INTRAVENOUS | Status: AC
  Administered 2012-04-19: 1 [IU]/h via INTRAVENOUS
  Filled 2012-04-18: qty 1

## 2012-04-18 MED ORDER — SODIUM BICARBONATE 8.4 % IV SOLN
INTRAVENOUS | Status: AC
  Administered 2012-04-19: 10:00:00
  Filled 2012-04-18 (×2): qty 2.5

## 2012-04-18 MED ORDER — CHLORHEXIDINE GLUCONATE 4 % EX LIQD
60.0000 mL | Freq: Once | CUTANEOUS | Status: AC
  Administered 2012-04-19: 4 via TOPICAL

## 2012-04-18 MED ORDER — EPINEPHRINE HCL 1 MG/ML IJ SOLN
0.5000 ug/min | INTRAVENOUS | Status: DC
  Filled 2012-04-18: qty 4

## 2012-04-18 MED ORDER — VANCOMYCIN HCL 1000 MG IV SOLR
1250.0000 mg | INTRAVENOUS | Status: DC
  Filled 2012-04-18: qty 1250

## 2012-04-18 MED ORDER — MAGNESIUM SULFATE 50 % IJ SOLN
40.0000 meq | INTRAMUSCULAR | Status: DC
  Filled 2012-04-18: qty 10

## 2012-04-18 MED ORDER — DIAZEPAM 5 MG PO TABS
5.0000 mg | ORAL_TABLET | Freq: Once | ORAL | Status: AC
  Administered 2012-04-19: 5 mg via ORAL
  Filled 2012-04-18: qty 1

## 2012-04-18 MED ORDER — CHLORHEXIDINE GLUCONATE 4 % EX LIQD: 60.0000 mL | Freq: Once | CUTANEOUS | Status: DC

## 2012-04-18 MED ORDER — ALPRAZOLAM 0.25 MG PO TABS: 0.2500 mg | ORAL_TABLET | ORAL | Status: DC | PRN

## 2012-04-18 MED ORDER — PHENYLEPHRINE HCL 10 MG/ML IJ SOLN
30.0000 ug/min | INTRAVENOUS | Status: DC
  Filled 2012-04-18: qty 2

## 2012-04-18 MED ORDER — CHLORHEXIDINE GLUCONATE 4 % EX LIQD
60.0000 mL | Freq: Once | CUTANEOUS | Status: DC
  Filled 2012-04-18: qty 60
  Filled 2012-04-18: qty 15
  Filled 2012-04-18: qty 45

## 2012-04-18 MED ORDER — DEXMEDETOMIDINE HCL 100 MCG/ML IV SOLN
0.1000 ug/kg/h | INTRAVENOUS | Status: AC
  Administered 2012-04-19: .2 ug/kg/h via INTRAVENOUS
  Filled 2012-04-18: qty 4

## 2012-04-18 MED ORDER — TRANEXAMIC ACID (OHS) PUMP PRIME SOLUTION
2.0000 mg/kg | INTRAVENOUS | Status: DC
  Filled 2012-04-18: qty 1.76

## 2012-04-18 MED ORDER — DEXTROSE 5 % IV SOLN
750.0000 mg | INTRAVENOUS | Status: DC
  Filled 2012-04-18 (×2): qty 750

## 2012-04-18 MED ORDER — TEMAZEPAM 15 MG PO CAPS
15.0000 mg | ORAL_CAPSULE | Freq: Once | ORAL | Status: AC | PRN
  Administered 2012-04-18: 15 mg via ORAL
  Filled 2012-04-18: qty 1

## 2012-04-18 MED ORDER — TRANEXAMIC ACID 100 MG/ML IV SOLN
1.5000 mg/kg/h | INTRAVENOUS | Status: AC
  Administered 2012-04-19: 40.82 mg/kg/h via INTRAVENOUS
  Filled 2012-04-18: qty 25

## 2012-04-18 MED ORDER — NITROGLYCERIN IN D5W 200-5 MCG/ML-% IV SOLN
2.0000 ug/min | INTRAVENOUS | Status: AC
  Administered 2012-04-19: 16 ug/min via INTRAVENOUS
  Filled 2012-04-18: qty 250

## 2012-04-18 MED ORDER — BISACODYL 5 MG PO TBEC
5.0000 mg | DELAYED_RELEASE_TABLET | Freq: Once | ORAL | Status: AC
  Administered 2012-04-18: 5 mg via ORAL
  Filled 2012-04-18: qty 1

## 2012-04-18 MED ORDER — METOPROLOL TARTRATE 12.5 MG HALF TABLET
12.5000 mg | ORAL_TABLET | Freq: Once | ORAL | Status: AC
  Administered 2012-04-19: 12.5 mg via ORAL
  Filled 2012-04-18: qty 1

## 2012-04-18 MED ORDER — CHLORHEXIDINE GLUCONATE 4 % EX LIQD
60.0000 mL | Freq: Once | CUTANEOUS | Status: AC
  Administered 2012-04-18: 4 via TOPICAL

## 2012-04-18 MED ORDER — POTASSIUM CHLORIDE 2 MEQ/ML IV SOLN
80.0000 meq | INTRAVENOUS | Status: DC
  Filled 2012-04-18: qty 40

## 2012-04-18 MED ORDER — VANCOMYCIN HCL 1000 MG IV SOLR
1500.0000 mg | INTRAVENOUS | Status: AC
  Administered 2012-04-19: 1500 mg via INTRAVENOUS
  Filled 2012-04-18: qty 1500

## 2012-04-18 MED ORDER — ~~LOC~~ CARDIAC SURGERY, PATIENT & FAMILY EDUCATION
Freq: Once | Status: DC
  Filled 2012-04-18: qty 1

## 2012-04-18 MED ORDER — DOPAMINE-DEXTROSE 3.2-5 MG/ML-% IV SOLN
2.0000 ug/kg/min | INTRAVENOUS | Status: DC
  Filled 2012-04-18: qty 250

## 2012-04-18 NOTE — Progress Notes (Signed)
Subjective:  No events. Doing well. No CP. Wants shower.   Objective:  Vital Signs in the last 24 hours: Temp:  [98.1 F (36.7 C)-98.7 F (37.1 C)] 98.1 F (36.7 C) (05/09 0817) Pulse Rate:  [52-66] 53  (05/09 0817) Resp:  [16-20] 20  (05/09 0817) BP: (101-122)/(60-77) 106/60 mmHg (05/09 0817) SpO2:  [96 %-97 %] 97 % (05/09 0817) Weight:  [88.2 kg (194 lb 7.1 oz)] 88.2 kg (194 lb 7.1 oz) (05/09 0657)  Intake/Output from previous day: 05/08 0701 - 05/09 0700 In: 1142.3 [P.O.:720; I.V.:422.3] Out: 350 [Urine:350]   Physical Exam: General: Well developed, well nourished, in no acute distress. Head:  Normocephalic and atraumatic. Lungs: Clear to auscultation and percussion. Heart: Normal S1 and S2.  No murmur, rubs or gallops.  Pulses: Pulses normal in all 4 extremities. Abdomen: soft, non-tender, positive bowel sounds. Extremities: No clubbing or cyanosis. No edema. Neurologic: Alert and oriented x 3.    Lab Results:  Basename 04/18/12 0525 04/17/12 1746  WBC 6.4 8.4  HGB 15.4 16.2  PLT 182 192    Basename 04/17/12 0046  NA 139  K 3.9  CL 106  CO2 25  GLUCOSE 100*  BUN 15  CREATININE 1.10     Telemetry: SB Personally viewed.   Assessment/Plan:  Principal Problem:  *Intermediate coronary syndrome Active Problems:  Coronary atherosclerosis of native coronary artery  Type II or unspecified type diabetes mellitus without mention of complication, not stated as uncontrolled    - Await CABG tomorrow.   - IV heparin  - NO further CP  - statin, ASA  - A1c - 7.0 (holding metformin)  -May shower  [name] 04/18/2012, 12:44 PM

## 2012-04-18 NOTE — Progress Notes (Signed)
ANTICOAGULATION CONSULT NOTE - Follow Up Consult  Pharmacy Consult for heparin Indication: CAD awaiting CABG Friday  No Known Allergies  Patient Measurements: Height: 5\' 8"  (172.7 cm) Weight: 194 lb 7.1 oz (88.2 kg) IBW/kg (Calculated) : 68.4  Heparin Dosing Weight: 86 kg  Vital Signs: Temp: 98.1 F (36.7 C) (05/09 0657) Temp src: Oral (05/09 0657) BP: 101/70 mmHg (05/09 0657) Pulse Rate: 66  (05/09 0657)  Labs:  Basename 04/18/12 0525 04/17/12 1746 04/17/12 0046 04/16/12 1148  HGB 15.4 16.2 -- --  HCT 44.4 45.5 42.1 --  PLT 182 192 174 --  APTT -- -- -- 32  LABPROT -- -- -- 13.6  INR -- -- -- 1.02  HEPARINUNFRC 0.79* 0.69 0.44 --  CREATININE -- -- 1.10 --  CKTOTAL -- -- -- --  CKMB -- -- -- --  TROPONINI -- -- -- --    Estimated Creatinine Clearance: 74.2 ml/min (by C-G formula based on Cr of 1.1).  Assessment: 66 yo male with multi vessel CAD, awaiting CABG, for Heparin  Goal of Therapy:  Heparin level 0.3-0.7 units/ml Monitor platelets by anticoagulation protocol: Yes   Plan:  Decrease heparin 1000 units/hr  Geannie Risen, PharmD, BCPS 04/18/2012 6:59 AM

## 2012-04-18 NOTE — Progress Notes (Signed)
2 Days Post-Op Procedure(s) (LRB): LEFT HEART CATHETERIZATION WITH CORONARY ANGIOGRAM (N/A) Subjective: No complaints  Objective: Vital signs in last 24 hours: Temp:  [98 F (36.7 C)-98.1 F (36.7 C)] 98 F (36.7 C) (05/09 1700) Pulse Rate:  [52-66] 58  (05/09 1700) Cardiac Rhythm:  [-] Normal sinus rhythm (05/09 0745) Resp:  [16-20] 18  (05/09 1700) BP: (101-128)/(60-80) 128/80 mmHg (05/09 1700) SpO2:  [96 %-97 %] 97 % (05/09 1700) Weight:  [88.2 kg (194 lb 7.1 oz)] 88.2 kg (194 lb 7.1 oz) (05/09 0657)  Hemodynamic parameters for last 24 hours:    Intake/Output from previous day: 05/08 0701 - 05/09 0700 In: 1142.3 [P.O.:720; I.V.:422.3] Out: 350 [Urine:350] Intake/Output this shift: Total I/O In: 480 [P.O.:480] Out: -   General appearance: alert and cooperative Heart: regular rate and rhythm, S1, S2 normal, no murmur, click, rub or gallop Lungs: clear to auscultation bilaterally  Lab Results:  Basename 04/18/12 0525 04/17/12 1746  WBC 6.4 8.4  HGB 15.4 16.2  HCT 44.4 45.5  PLT 182 192   BMET:  Basename 04/17/12 0046  NA 139  K 3.9  CL 106  CO2 25  GLUCOSE 100*  BUN 15  CREATININE 1.10  CALCIUM 9.3    PT/INR:  Basename 04/16/12 1148  LABPROT 13.6  INR 1.02   ABG    Component Value Date/Time   TCO2 25 07/02/2009 1228   CBG (last 3)   Basename 04/18/12 1328 04/18/12 0811 04/17/12 2133  GLUCAP 189* 145* 129*    Assessment/Plan: S/P Procedure(s) (LRB): LEFT HEART CATHETERIZATION WITH CORONARY ANGIOGRAM (N/A) Stable for CABG in am. Patient and wife have no further questions.   LOS: 2 days    Mclane Arora K 04/18/2012

## 2012-04-19 ENCOUNTER — Encounter (HOSPITAL_COMMUNITY): Payer: Self-pay | Admitting: Anesthesiology

## 2012-04-19 ENCOUNTER — Inpatient Hospital Stay (HOSPITAL_COMMUNITY): Payer: BC Managed Care – PPO | Admitting: Anesthesiology

## 2012-04-19 ENCOUNTER — Encounter (HOSPITAL_COMMUNITY)
Admission: RE | Disposition: A | Discharge status: Home / Self Care | Payer: Self-pay | Source: Ambulatory Visit | Attending: Surgery

## 2012-04-19 ENCOUNTER — Inpatient Hospital Stay (HOSPITAL_COMMUNITY): Payer: BC Managed Care – PPO

## 2012-04-19 DIAGNOSIS — I2510 Atherosclerotic heart disease of native coronary artery without angina pectoris: Secondary | ICD-10-CM

## 2012-04-19 HISTORY — PX: CORONARY ARTERY BYPASS GRAFT: SHX141

## 2012-04-19 LAB — POCT I-STAT 3, ART BLOOD GAS (G3+)
Acid-base deficit: 2 mmol/L (ref 0.0–2.0)
Acid-base deficit: 4 mmol/L — ABNORMAL HIGH (ref 0.0–2.0)
Acid-base deficit: 3 mmol/L — ABNORMAL HIGH (ref 0.0–2.0)
Bicarbonate: 24.7 mEq/L — ABNORMAL HIGH (ref 20.0–24.0)
Bicarbonate: 22.3 mEq/L (ref 20.0–24.0)
Bicarbonate: 22.9 mEq/L (ref 20.0–24.0)
Bicarbonate: 20.4 mEq/L (ref 20.0–24.0)
Bicarbonate: 22.8 mEq/L (ref 20.0–24.0)
O2 Saturation: 99 %
O2 Saturation: 93 %
O2 Saturation: 99 %
Patient temperature: 37
Patient temperature: 37.1
TCO2: 26 mmol/L (ref 0–100)
TCO2: 23 mmol/L (ref 0–100)
TCO2: 21 mmol/L (ref 0–100)
TCO2: 24 mmol/L (ref 0–100)
pCO2 arterial: 44.2 mmHg (ref 35.0–45.0)
pCO2 arterial: 35.9 mmHg (ref 35.0–45.0)
pH, Arterial: 7.355 (ref 7.350–7.450)
pO2, Arterial: 363 mmHg — ABNORMAL HIGH (ref 80.0–100.0)
pO2, Arterial: 61 mmHg — ABNORMAL LOW (ref 80.0–100.0)

## 2012-04-19 LAB — POCT I-STAT 4, (NA,K, GLUC, HGB,HCT)
Glucose, Bld: 144 mg/dL — ABNORMAL HIGH (ref 70–99)
Glucose, Bld: 129 mg/dL — ABNORMAL HIGH (ref 70–99)
Glucose, Bld: 157 mg/dL — ABNORMAL HIGH (ref 70–99)
Glucose, Bld: 167 mg/dL — ABNORMAL HIGH (ref 70–99)
HCT: 35 % — ABNORMAL LOW (ref 39.0–52.0)
HCT: 32 % — ABNORMAL LOW (ref 39.0–52.0)
Hemoglobin: 10.9 g/dL — ABNORMAL LOW (ref 13.0–17.0)
Hemoglobin: 11.6 g/dL — ABNORMAL LOW (ref 13.0–17.0)
Potassium: 4.4 mEq/L (ref 3.5–5.1)
Potassium: 3.8 mEq/L (ref 3.5–5.1)
Sodium: 140 mEq/L (ref 135–145)
Sodium: 140 mEq/L (ref 135–145)

## 2012-04-19 LAB — GLUCOSE, CAPILLARY
Glucose-Capillary: 144 mg/dL — ABNORMAL HIGH (ref 70–99)
Glucose-Capillary: 103 mg/dL — ABNORMAL HIGH (ref 70–99)

## 2012-04-19 LAB — CBC
HCT: 44 % (ref 39.0–52.0)
HCT: 33.9 % — ABNORMAL LOW (ref 39.0–52.0)
Hemoglobin: 15.1 g/dL (ref 13.0–17.0)
Hemoglobin: 12.1 g/dL — ABNORMAL LOW (ref 13.0–17.0)
Hemoglobin: 12.8 g/dL — ABNORMAL LOW (ref 13.0–17.0)
MCH: 31.7 pg (ref 26.0–34.0)
MCH: 32.4 pg (ref 26.0–34.0)
MCH: 32.1 pg (ref 26.0–34.0)
MCHC: 34.3 g/dL (ref 30.0–36.0)
MCHC: 35.7 g/dL (ref 30.0–36.0)
MCV: 92.4 fL (ref 78.0–100.0)
MCV: 91 fL (ref 78.0–100.0)
RBC: 4.76 MIL/uL (ref 4.22–5.81)
RBC: 3.99 MIL/uL — ABNORMAL LOW (ref 4.22–5.81)

## 2012-04-19 LAB — HEMOGLOBIN AND HEMATOCRIT, BLOOD
HCT: 30.5 % — ABNORMAL LOW (ref 39.0–52.0)
Hemoglobin: 10.6 g/dL — ABNORMAL LOW (ref 13.0–17.0)

## 2012-04-19 LAB — URINALYSIS, ROUTINE W REFLEX MICROSCOPIC
Bilirubin Urine: NEGATIVE
Hgb urine dipstick: NEGATIVE
Nitrite: NEGATIVE
Protein, ur: NEGATIVE mg/dL
Specific Gravity, Urine: 1.017 (ref 1.005–1.030)
Urobilinogen, UA: 0.2 mg/dL (ref 0.0–1.0)

## 2012-04-19 LAB — POCT I-STAT, CHEM 8
BUN: 11 mg/dL (ref 6–23)
Calcium, Ion: 1.11 mmol/L — ABNORMAL LOW (ref 1.12–1.32)
Creatinine, Ser: 1 mg/dL (ref 0.50–1.35)
TCO2: 22 mmol/L (ref 0–100)

## 2012-04-19 LAB — URINE MICROSCOPIC-ADD ON

## 2012-04-19 LAB — CREATININE, SERUM: GFR calc Af Amer: >90 mL/min (ref >90)

## 2012-04-19 LAB — POCT I-STAT GLUCOSE: Glucose, Bld: 128 mg/dL — ABNORMAL HIGH (ref 70–99)

## 2012-04-19 LAB — MAGNESIUM: Magnesium: 2.6 mg/dL — ABNORMAL HIGH (ref 1.5–2.5)

## 2012-04-19 LAB — PLATELET COUNT: Platelets: 114 10*3/uL — ABNORMAL LOW (ref 150–400)

## 2012-04-19 SURGERY — CORONARY ARTERY BYPASS GRAFTING (CABG)
Anesthesia: General | Site: Chest | Wound class: Clean

## 2012-04-19 MED ORDER — ACETAMINOPHEN 160 MG/5ML PO SOLN: 650.0000 mg | ORAL | Status: AC

## 2012-04-19 MED ORDER — MORPHINE SULFATE 2 MG/ML IJ SOLN
1.0000 mg | INTRAMUSCULAR | Status: DC | PRN
  Filled 2012-04-19: qty 1

## 2012-04-19 MED ORDER — ASPIRIN EC 325 MG PO TBEC
325.0000 mg | DELAYED_RELEASE_TABLET | Freq: Every day | ORAL | Status: DC
  Filled 2012-04-19: qty 1

## 2012-04-19 MED ORDER — DOCUSATE SODIUM 100 MG PO CAPS: 200.0000 mg | ORAL_CAPSULE | Freq: Every day | ORAL | Status: DC

## 2012-04-19 MED ORDER — SODIUM CHLORIDE 0.9 % IJ SOLN: 3.0000 mL | INTRAMUSCULAR | Status: DC | PRN

## 2012-04-19 MED ORDER — SODIUM CHLORIDE 0.9 % IV SOLN: 250.0000 mL | INTRAVENOUS | Status: DC

## 2012-04-19 MED ORDER — DEXTROSE 5 % IV SOLN
1.5000 g | Freq: Two times a day (BID) | INTRAVENOUS | Status: DC
  Administered 2012-04-19: 1.5 g via INTRAVENOUS
  Filled 2012-04-19 (×3): qty 1.5

## 2012-04-19 MED ORDER — METOPROLOL TARTRATE 12.5 MG HALF TABLET
12.5000 mg | ORAL_TABLET | Freq: Two times a day (BID) | ORAL | Status: DC
  Filled 2012-04-19 (×3): qty 1

## 2012-04-19 MED ORDER — METOPROLOL TARTRATE 25 MG/10 ML ORAL SUSPENSION
12.5000 mg | Freq: Two times a day (BID) | ORAL | Status: DC
  Filled 2012-04-19 (×3): qty 5

## 2012-04-19 MED ORDER — LACTATED RINGERS IV SOLN
INTRAVENOUS | Status: DC | PRN
  Administered 2012-04-19: 07:00:00 via INTRAVENOUS

## 2012-04-19 MED ORDER — SODIUM CHLORIDE 0.9 % IJ SOLN
3.0000 mL | Freq: Two times a day (BID) | INTRAMUSCULAR | Status: DC
Start: 2012-04-20 — End: 2012-04-20

## 2012-04-19 MED ORDER — FAMOTIDINE IN NACL 20-0.9 MG/50ML-% IV SOLN
20.0000 mg | Freq: Two times a day (BID) | INTRAVENOUS | Status: DC
  Administered 2012-04-19: 20 mg via INTRAVENOUS

## 2012-04-19 MED ORDER — BISACODYL 10 MG RE SUPP: 10.0000 mg | Freq: Every day | RECTAL | Status: DC

## 2012-04-19 MED ORDER — ACETAMINOPHEN 160 MG/5ML PO SOLN: 975.0000 mg | Freq: Four times a day (QID) | ORAL | Status: DC

## 2012-04-19 MED ORDER — VANCOMYCIN HCL IN DEXTROSE 1-5 GM/200ML-% IV SOLN
1000.0000 mg | Freq: Once | INTRAVENOUS | Status: AC
  Administered 2012-04-19: 1000 mg via INTRAVENOUS
  Filled 2012-04-19 (×2): qty 200

## 2012-04-19 MED ORDER — DEXTROSE 5 % IV SOLN
0.0000 ug/min | INTRAVENOUS | Status: DC
  Filled 2012-04-19: qty 2

## 2012-04-19 MED ORDER — SODIUM CHLORIDE 0.9 % IV SOLN: INTRAVENOUS | Status: DC

## 2012-04-19 MED ORDER — LACTATED RINGERS IV SOLN: INTRAVENOUS | Status: DC

## 2012-04-19 MED ORDER — FENTANYL CITRATE 0.05 MG/ML IJ SOLN
INTRAMUSCULAR | Status: DC | PRN
  Administered 2012-04-19 (×3): 250 ug via INTRAVENOUS
  Administered 2012-04-19: 50 ug via INTRAVENOUS
  Administered 2012-04-19: 200 ug via INTRAVENOUS

## 2012-04-19 MED ORDER — ACETAMINOPHEN 650 MG RE SUPP
650.0000 mg | RECTAL | Status: AC
  Administered 2012-04-19: 650 mg via RECTAL

## 2012-04-19 MED ORDER — OXYCODONE HCL 5 MG PO TABS
5.0000 mg | ORAL_TABLET | ORAL | Status: DC | PRN
  Administered 2012-04-20 (×2): 10 mg via ORAL
  Filled 2012-04-19 (×2): qty 2

## 2012-04-19 MED ORDER — ASPIRIN 81 MG PO CHEW: 324.0000 mg | CHEWABLE_TABLET | Freq: Every day | ORAL | Status: DC

## 2012-04-19 MED ORDER — LACTATED RINGERS IV SOLN
INTRAVENOUS | Status: DC | PRN
  Administered 2012-04-19 (×2): via INTRAVENOUS

## 2012-04-19 MED ORDER — PROTAMINE SULFATE 10 MG/ML IV SOLN
INTRAVENOUS | Status: DC | PRN
  Administered 2012-04-19: 35 mg via INTRAVENOUS

## 2012-04-19 MED ORDER — MORPHINE SULFATE 2 MG/ML IJ SOLN
2.0000 mg | INTRAMUSCULAR | Status: DC | PRN
  Administered 2012-04-19 – 2012-04-20 (×5): 2 mg via INTRAVENOUS
  Filled 2012-04-19 (×2): qty 1
  Filled 2012-04-19: qty 2

## 2012-04-19 MED ORDER — MAGNESIUM SULFATE 40 MG/ML IJ SOLN
4.0000 g | Freq: Once | INTRAMUSCULAR | Status: AC
  Administered 2012-04-19: 4 g via INTRAVENOUS
  Filled 2012-04-19: qty 100

## 2012-04-19 MED ORDER — PROPOFOL 10 MG/ML IV EMUL
INTRAVENOUS | Status: DC | PRN
  Administered 2012-04-19 (×2): 50 mg via INTRAVENOUS

## 2012-04-19 MED ORDER — LACTATED RINGERS IV SOLN: 500.0000 mL | Freq: Once | INTRAVENOUS | Status: AC | PRN

## 2012-04-19 MED ORDER — VECURONIUM BROMIDE 10 MG IV SOLR
INTRAVENOUS | Status: DC | PRN
  Administered 2012-04-19 (×2): 5 mg via INTRAVENOUS

## 2012-04-19 MED ORDER — ALBUMIN HUMAN 5 % IV SOLN
250.0000 mL | INTRAVENOUS | Status: DC | PRN
  Administered 2012-04-19: 250 mL via INTRAVENOUS

## 2012-04-19 MED ORDER — POTASSIUM CHLORIDE 10 MEQ/50ML IV SOLN
10.0000 meq | INTRAVENOUS | Status: AC
  Administered 2012-04-19 (×3): 10 meq via INTRAVENOUS

## 2012-04-19 MED ORDER — EPHEDRINE SULFATE 50 MG/ML IJ SOLN
INTRAMUSCULAR | Status: DC | PRN
  Administered 2012-04-19 (×2): 5 mg via INTRAVENOUS

## 2012-04-19 MED ORDER — NITROGLYCERIN IN D5W 200-5 MCG/ML-% IV SOLN: 0.0000 ug/min | INTRAVENOUS | Status: DC

## 2012-04-19 MED ORDER — THROMBIN 20000 UNITS EX KIT
PACK | CUTANEOUS | Status: DC | PRN
  Administered 2012-04-19: 20000 [IU] via TOPICAL

## 2012-04-19 MED ORDER — ACETAMINOPHEN 500 MG PO TABS
1000.0000 mg | ORAL_TABLET | Freq: Four times a day (QID) | ORAL | Status: DC
  Administered 2012-04-19 – 2012-04-20 (×3): 1000 mg via ORAL
  Filled 2012-04-19 (×7): qty 2

## 2012-04-19 MED ORDER — ALBUMIN HUMAN 5 % IV SOLN
INTRAVENOUS | Status: DC | PRN
  Administered 2012-04-19 (×2): via INTRAVENOUS

## 2012-04-19 MED ORDER — BISACODYL 5 MG PO TBEC: 10.0000 mg | DELAYED_RELEASE_TABLET | Freq: Every day | ORAL | Status: DC

## 2012-04-19 MED ORDER — SODIUM CHLORIDE 0.45 % IV SOLN: INTRAVENOUS | Status: DC

## 2012-04-19 MED ORDER — DEXTROSE 5 % IV SOLN
1.5000 g | Freq: Two times a day (BID) | INTRAVENOUS | Status: DC
  Filled 2012-04-19 (×2): qty 1.5

## 2012-04-19 MED ORDER — HEMOSTATIC AGENTS (NO CHARGE) OPTIME
TOPICAL | Status: DC | PRN
  Administered 2012-04-19: 1 via TOPICAL

## 2012-04-19 MED ORDER — METOPROLOL TARTRATE 1 MG/ML IV SOLN
2.5000 mg | INTRAVENOUS | Status: DC | PRN
  Administered 2012-04-19: 5 mg via INTRAVENOUS

## 2012-04-19 MED ORDER — ONDANSETRON HCL 4 MG/2ML IJ SOLN
4.0000 mg | Freq: Four times a day (QID) | INTRAMUSCULAR | Status: DC | PRN
  Administered 2012-04-19: 4 mg via INTRAVENOUS
  Filled 2012-04-19: qty 2

## 2012-04-19 MED ORDER — INSULIN REGULAR BOLUS VIA INFUSION
0.0000 [IU] | Freq: Three times a day (TID) | INTRAVENOUS | Status: DC
  Filled 2012-04-19: qty 10

## 2012-04-19 MED ORDER — LIDOCAINE HCL (CARDIAC) 20 MG/ML IV SOLN
INTRAVENOUS | Status: DC | PRN
  Administered 2012-04-19: 50 mg via INTRAVENOUS

## 2012-04-19 MED ORDER — HEPARIN SODIUM (PORCINE) 1000 UNIT/ML IJ SOLN
INTRAMUSCULAR | Status: DC | PRN
  Administered 2012-04-19: 33000 [IU] via INTRAVENOUS

## 2012-04-19 MED ORDER — MIDAZOLAM HCL 5 MG/5ML IJ SOLN
INTRAMUSCULAR | Status: DC | PRN
  Administered 2012-04-19: 6 mg via INTRAVENOUS
  Administered 2012-04-19 (×2): 2 mg via INTRAVENOUS
  Administered 2012-04-19: 4 mg via INTRAVENOUS

## 2012-04-19 MED ORDER — ROCURONIUM BROMIDE 100 MG/10ML IV SOLN
INTRAVENOUS | Status: DC | PRN
  Administered 2012-04-19: 30 mg via INTRAVENOUS
  Administered 2012-04-19: 70 mg via INTRAVENOUS

## 2012-04-19 MED ORDER — PANTOPRAZOLE SODIUM 40 MG PO TBEC: 40.0000 mg | DELAYED_RELEASE_TABLET | Freq: Every day | ORAL | Status: DC

## 2012-04-19 MED ORDER — MIDAZOLAM HCL 2 MG/2ML IJ SOLN: 2.0000 mg | INTRAMUSCULAR | Status: DC | PRN

## 2012-04-19 MED ORDER — SODIUM CHLORIDE 0.9 % IV SOLN
INTRAVENOUS | Status: DC
  Filled 2012-04-19: qty 1

## 2012-04-19 MED ORDER — SODIUM CHLORIDE 0.9 % IV SOLN
0.1000 ug/kg/h | INTRAVENOUS | Status: DC
  Filled 2012-04-19: qty 2

## 2012-04-19 SURGICAL SUPPLY — 97 items
ATTRACTOMAT 16X20 MAGNETIC DRP (DRAPES) ×2 IMPLANT
BAG DECANTER FOR FLEXI CONT (MISCELLANEOUS) ×2 IMPLANT
BANDAGE ELASTIC 4 VELCRO ST LF (GAUZE/BANDAGES/DRESSINGS) ×2 IMPLANT
BANDAGE ELASTIC 6 VELCRO ST LF (GAUZE/BANDAGES/DRESSINGS) ×2 IMPLANT
BANDAGE GAUZE ELAST BULKY 4 IN (GAUZE/BANDAGES/DRESSINGS) ×2 IMPLANT
BASKET HEART (ORDER IN 25'S) (MISCELLANEOUS) ×1
BASKET HEART (ORDER IN 25S) (MISCELLANEOUS) ×1 IMPLANT
BLADE STERNUM SYSTEM 6 (BLADE) ×2 IMPLANT
CANISTER SUCTION 2500CC (MISCELLANEOUS) ×2 IMPLANT
CATH ROBINSON RED A/P 18FR (CATHETERS) ×4 IMPLANT
CATH THORACIC 28FR (CATHETERS) ×2 IMPLANT
CATH THORACIC 28FR RT ANG (CATHETERS) IMPLANT
CATH THORACIC 36FR (CATHETERS) ×2 IMPLANT
CATH THORACIC 36FR RT ANG (CATHETERS) ×2 IMPLANT
CLIP TI MEDIUM 24 (CLIP) IMPLANT
CLIP TI WIDE RED SMALL 24 (CLIP) IMPLANT
CLOTH BEACON ORANGE TIMEOUT ST (SAFETY) ×2 IMPLANT
COVER SURGICAL LIGHT HANDLE (MISCELLANEOUS) ×4 IMPLANT
CRADLE DONUT ADULT HEAD (MISCELLANEOUS) ×2 IMPLANT
DRAPE CARDIOVASCULAR INCISE (DRAPES) ×2
DRAPE SLUSH MACHINE 52X66 (DRAPES) IMPLANT
DRAPE SLUSH/WARMER DISC (DRAPES) IMPLANT
DRAPE SRG 135X102X78XABS (DRAPES) ×1 IMPLANT
DRSG COVADERM 4X10 (GAUZE/BANDAGES/DRESSINGS) ×2 IMPLANT
DRSG COVADERM 4X14 (GAUZE/BANDAGES/DRESSINGS) ×2 IMPLANT
ELECT CAUTERY BLADE 6.4 (BLADE) ×2 IMPLANT
ELECT REM PT RETURN 9FT ADLT (ELECTROSURGICAL) ×4
ELECTRODE REM PT RTRN 9FT ADLT (ELECTROSURGICAL) ×2 IMPLANT
GLOVE BIO SURGEON STRL SZ 6 (GLOVE) IMPLANT
GLOVE BIO SURGEON STRL SZ 6.5 (GLOVE) IMPLANT
GLOVE BIO SURGEON STRL SZ7 (GLOVE) IMPLANT
GLOVE BIO SURGEON STRL SZ7.5 (GLOVE) IMPLANT
GLOVE BIOGEL PI IND STRL 6 (GLOVE) IMPLANT
GLOVE BIOGEL PI IND STRL 6.5 (GLOVE) IMPLANT
GLOVE BIOGEL PI IND STRL 7.0 (GLOVE) IMPLANT
GLOVE BIOGEL PI INDICATOR 6 (GLOVE)
GLOVE BIOGEL PI INDICATOR 6.5 (GLOVE)
GLOVE BIOGEL PI INDICATOR 7.0 (GLOVE)
GLOVE EUDERMIC 7 POWDERFREE (GLOVE) ×4 IMPLANT
GLOVE ORTHO TXT STRL SZ7.5 (GLOVE) IMPLANT
GOWN PREVENTION PLUS XLARGE (GOWN DISPOSABLE) ×2 IMPLANT
GOWN STRL NON-REIN LRG LVL3 (GOWN DISPOSABLE) ×8 IMPLANT
HEMOSTAT POWDER SURGIFOAM 1G (HEMOSTASIS) ×6 IMPLANT
HEMOSTAT SURGICEL 2X14 (HEMOSTASIS) ×2 IMPLANT
INSERT FOGARTY 61MM (MISCELLANEOUS) IMPLANT
INSERT FOGARTY XLG (MISCELLANEOUS) IMPLANT
KIT BASIN OR (CUSTOM PROCEDURE TRAY) ×2 IMPLANT
KIT CATH CPB BARTLE (MISCELLANEOUS) ×2 IMPLANT
KIT ROOM TURNOVER OR (KITS) ×2 IMPLANT
KIT SUCTION CATH 14FR (SUCTIONS) ×2 IMPLANT
KIT VASOVIEW W/TROCAR VH 2000 (KITS) ×2 IMPLANT
NS IRRIG 1000ML POUR BTL (IV SOLUTION) ×10 IMPLANT
PACK OPEN HEART (CUSTOM PROCEDURE TRAY) ×2 IMPLANT
PAD ARMBOARD 7.5X6 YLW CONV (MISCELLANEOUS) ×4 IMPLANT
PENCIL BUTTON HOLSTER BLD 10FT (ELECTRODE) ×2 IMPLANT
PUNCH AORTIC ROTATE 4.0MM (MISCELLANEOUS) IMPLANT
PUNCH AORTIC ROTATE 4.5MM 8IN (MISCELLANEOUS) ×2 IMPLANT
PUNCH AORTIC ROTATE 5MM 8IN (MISCELLANEOUS) IMPLANT
SET CARDIOPLEGIA MPS 5001102 (MISCELLANEOUS) ×2 IMPLANT
SPONGE GAUZE 4X4 12PLY (GAUZE/BANDAGES/DRESSINGS) ×2 IMPLANT
SPONGE INTESTINAL PEANUT (DISPOSABLE) IMPLANT
SPONGE LAP 18X18 X RAY DECT (DISPOSABLE) IMPLANT
SPONGE LAP 4X18 X RAY DECT (DISPOSABLE) ×2 IMPLANT
SUT BONE WAX W31G (SUTURE) ×2 IMPLANT
SUT MNCRL AB 4-0 PS2 18 (SUTURE) ×2 IMPLANT
SUT PROLENE 3 0 SH DA (SUTURE) IMPLANT
SUT PROLENE 3 0 SH1 36 (SUTURE) ×2 IMPLANT
SUT PROLENE 4 0 RB 1 (SUTURE)
SUT PROLENE 4 0 SH DA (SUTURE) IMPLANT
SUT PROLENE 4-0 RB1 .5 CRCL 36 (SUTURE) IMPLANT
SUT PROLENE 5 0 C 1 36 (SUTURE) IMPLANT
SUT PROLENE 6 0 C 1 30 (SUTURE) IMPLANT
SUT PROLENE 7 0 BV 1 (SUTURE) IMPLANT
SUT PROLENE 7 0 BV1 MDA (SUTURE) ×2 IMPLANT
SUT PROLENE 8 0 BV175 6 (SUTURE) IMPLANT
SUT SILK  1 MH (SUTURE)
SUT SILK 1 MH (SUTURE) IMPLANT
SUT STEEL STERNAL CCS#1 18IN (SUTURE) IMPLANT
SUT STEEL SZ 6 DBL 3X14 BALL (SUTURE) IMPLANT
SUT VIC AB 1 CTX 36 (SUTURE) ×4
SUT VIC AB 1 CTX36XBRD ANBCTR (SUTURE) ×2 IMPLANT
SUT VIC AB 2-0 CT1 27 (SUTURE) ×2
SUT VIC AB 2-0 CT1 TAPERPNT 27 (SUTURE) ×1 IMPLANT
SUT VIC AB 2-0 CTX 27 (SUTURE) IMPLANT
SUT VIC AB 3-0 SH 27 (SUTURE)
SUT VIC AB 3-0 SH 27X BRD (SUTURE) IMPLANT
SUT VIC AB 3-0 X1 27 (SUTURE) IMPLANT
SUT VICRYL 4-0 PS2 18IN ABS (SUTURE) IMPLANT
SUTURE E-PAK OPEN HEART (SUTURE) ×2 IMPLANT
SYSTEM SAHARA CHEST DRAIN ATS (WOUND CARE) ×2 IMPLANT
TOWEL OR 17X24 6PK STRL BLUE (TOWEL DISPOSABLE) ×2 IMPLANT
TOWEL OR 17X26 10 PK STRL BLUE (TOWEL DISPOSABLE) ×2 IMPLANT
TRAY FOLEY IC TEMP SENS 14FR (CATHETERS) ×2 IMPLANT
TUBE SUCT INTRACARD DLP 20F (MISCELLANEOUS) ×2 IMPLANT
TUBING INSUFFLATION 10FT LAP (TUBING) ×2 IMPLANT
UNDERPAD 30X30 INCONTINENT (UNDERPADS AND DIAPERS) ×2 IMPLANT
WATER STERILE IRR 1000ML POUR (IV SOLUTION) ×4 IMPLANT

## 2012-04-19 NOTE — Anesthesia Preprocedure Evaluation (Addendum)
Anesthesia Evaluation  Patient identified by MRN, date of birth, ID band Patient awake    Reviewed: Allergy & Precautions, H&P , NPO status , Patient's Chart, lab work & pertinent test results, reviewed documented beta blocker date and time   Airway Mallampati: I      Dental  (+) Teeth Intact and Dental Advisory Given   Pulmonary shortness of breath, pneumonia ,  breath sounds clear to auscultation        Cardiovascular + angina with exertion + CAD Rhythm:Regular     Neuro/Psych negative neurological ROS  negative psych ROS   GI/Hepatic negative GI ROS, Neg liver ROS,   Endo/Other  Diabetes mellitus-, Well Controlled, Type 2, Oral Hypoglycemic Agents  Renal/GU negative Renal ROS     Musculoskeletal   Abdominal Normal abdominal exam  (+)   Peds  Hematology negative hematology ROS (+)   Anesthesia Other Findings   Reproductive/Obstetrics                          Anesthesia Physical Anesthesia Plan  ASA: III  Anesthesia Plan: General   Post-op Pain Management:    Induction: Intravenous  Airway Management Planned: Oral ETT  Additional Equipment: Arterial line, CVP, PA Cath and Ultrasound Guidance Line Placement  Intra-op Plan: Delibrate Circulatory arrest per surgeon request  Post-operative Plan: Post-operative intubation/ventilation  Informed Consent: I have reviewed the patients History and Physical, chart, labs and discussed the procedure including the risks, benefits and alternatives for the proposed anesthesia with the patient or authorized representative who has indicated his/her understanding and acceptance.   Dental advisory given  Plan Discussed with: Anesthesiologist  Anesthesia Plan Comments:         Anesthesia Quick Evaluation

## 2012-04-19 NOTE — Transfer of Care (Signed)
Immediate Anesthesia Transfer of Care Note  Patient: [name]  Procedure(s) Performed: Procedure(s) (LRB): CORONARY ARTERY BYPASS GRAFTING (CABG) (N/A)  Patient Location: SICU  Anesthesia Type: General  Level of Consciousness: unresponsive  Airway & Oxygen Therapy: Patient remains intubated per anesthesia plan  Post-op Assessment: Report given to PACU RN and Post -op Vital signs reviewed and stable  Post vital signs: Reviewed  Complications: No apparent anesthesia complications

## 2012-04-19 NOTE — Brief Op Note (Signed)
04/16/2012 - 04/19/2012  10:59 AM  PATIENT:  [name]  66 y.o. male  PRE-OPERATIVE DIAGNOSIS:  coronary artery disease  POST-OPERATIVE DIAGNOSIS:  coronary artery disease  PROCEDURE:  Procedure(s): CORONARY ARTERY BYPASS GRAFTING (CABG) x 3 (LIMA-LAD, SVG-dCx, SVG- PD), EVH right leg  SURGEON:  Surgeon(s): Alleen Borne, MD  ASSISTANT: Coral Ceo, PA-C  ANESTHESIA:   general  PATIENT CONDITION:  ICU - intubated and hemodynamically stable.  PRE-OPERATIVE WEIGHT: 88 kg

## 2012-04-19 NOTE — Care Management Note (Signed)
    Page 1 of 1   04/19/2012     4:06:41 PM   CARE MANAGEMENT NOTE 04/19/2012  Patient:  [name],[name]   Account Number:  [id number]  Date Initiated:  04/19/2012  Documentation initiated by:  Audrionna Lampton  Subjective/Objective Assessment:   PT S/P CABG X3 ON 04/19/12.  PTA, PT INDEPENDENT, LIVES WITH SPOUSE, AND HAS SUPPORTIVE SON.     Action/Plan:   FAMILY TO PROVIDE CARE AT DISCHARGE. WILL FOLLOW FOR HOME NEEDS AS PT PROGRESSES.   Anticipated DC Date:  04/24/2012   Anticipated DC Plan:  HOME W HOME HEALTH SERVICES      DC Planning Services  CM consult      Choice offered to / List presented to:             Status of service:  In process, will continue to follow Medicare Important Message given?   (If response is "NO", the following Medicare IM given date fields will be blank) Date Medicare IM given:   Date Additional Medicare IM given:    Discharge Disposition:    Per UR Regulation:    If discussed at Long Length of Stay Meetings, dates discussed:    Comments:

## 2012-04-19 NOTE — Plan of Care (Signed)
Problem: Consults Goal: Cardiac Surgery Patient Education ( See Patient Education module for education specifics.)  Outcome: Completed/Met Date Met:  04/19/12 Pt and wife have read "going for cardiac surgery" book and seen both pre-op and post-op CABG videos.  Denies questions.

## 2012-04-19 NOTE — Procedures (Signed)
Extubation Procedure Note  Patient Details:   Name: [name] DOB: [birth year] MRN: [id number]   Airway Documentation:   Pt extubated at 16:30 to 4L [place] no stridor noted. BBS equal pt able to vocalize NIF -20 VC 1.3L  Evaluation  O2 sats: stable throughout Complications: No apparent complications Patient did tolerate procedure well. Bilateral Breath Sounds: Clear    Derryl Harbor 04/19/2012, 4:30 PM

## 2012-04-19 NOTE — Anesthesia Procedure Notes (Signed)
Procedures RIJ [name]: [id number]: Procedures: Right IJ [name] Catheter Insertion: The patient was identified and consent obtained.  TO was performed, and full barrier precautions were used.  The skin was anesthetized with lidocaine-4cc plain with 25g needle.  Once the vein was located with the 22 ga. needle using ultrasound guidance , the wire was inserted into the vein.  The wire location was confirmed with ultrasound.  The tissue was dilated and the 8.5 [place] cordis catheter was carefully inserted. Afterwards [name] catheter was inserted. PA catheter at 45cm.  The patient tolerated the procedure well.   CE

## 2012-04-19 NOTE — Preoperative (Signed)
Beta Blockers   Reason not to administer Beta Blockers:Not Applicable. Metoprolol 12.5 mg @0500  04/19/12

## 2012-04-19 NOTE — Progress Notes (Signed)
Patient ID: [name], male   DOB: 22-Feb-1946, 66 y.o.   MRN: [id number]  Filed Vitals:   04/19/12 1730 04/19/12 1800 04/19/12 1830 04/19/12 1900  BP:  115/70  132/80  Pulse: 81 80 81 80  Temp: 98.6 F (37 C) 98.6 F (37 C) 98.6 F (37 C) 98.8 F (37.1 C)  TempSrc: Core (Comment) Core (Comment)    Resp: 19 16 13 20   Height:      Weight:      SpO2: 99% 98% 98% 99%   Extubated  Urine output ok CT output low  CBC    Component Value Date/Time   WBC 14.9* 04/19/2012 1900   RBC 3.99* 04/19/2012 1900   HGB 12.8* 04/19/2012 1900   HCT 36.3* 04/19/2012 1900   PLT 127* 04/19/2012 1900   MCV 91.0 04/19/2012 1900   MCH 32.1 04/19/2012 1900   MCHC 35.3 04/19/2012 1900   RDW 12.6 04/19/2012 1900    BMET    Component Value Date/Time   NA 140 04/19/2012 1859   K 4.1 04/19/2012 1859   CL 105 04/19/2012 1859   CO2 27 04/18/2012 2018   GLUCOSE 115* 04/19/2012 1859   BUN 11 04/19/2012 1859   CREATININE 0.87 04/19/2012 1900   CALCIUM 9.7 04/18/2012 2018   GFRNONAA 90* 04/19/2012 1900   GFRAA >90 04/19/2012 1900    A/P:  Stable postop course

## 2012-04-19 NOTE — Anesthesia Postprocedure Evaluation (Signed)
  Anesthesia Post-op Note  Patient: [name]  Procedure(s) Performed: Procedure(s) (LRB): CORONARY ARTERY BYPASS GRAFTING (CABG) (N/A)  Patient Location: PACU and SICU  Anesthesia Type: General  Level of Consciousness: Patient remains intubated per anesthesia plan  Airway and Oxygen Therapy: Patient remains intubated per anesthesia plan  Post-op Pain: mild  Post-op Assessment: Post-op Vital signs reviewed  Post-op Vital Signs: Reviewed  Complications: No apparent anesthesia complications

## 2012-04-20 ENCOUNTER — Inpatient Hospital Stay (HOSPITAL_COMMUNITY): Payer: BC Managed Care – PPO

## 2012-04-20 LAB — CBC
Hemoglobin: 12.9 g/dL — ABNORMAL LOW (ref 13.0–17.0)
MCH: 31.9 pg (ref 26.0–34.0)
MCV: 91.1 fL (ref 78.0–100.0)
Platelets: 147 10*3/uL — ABNORMAL LOW (ref 150–400)
RBC: 4.05 MIL/uL — ABNORMAL LOW (ref 4.22–5.81)

## 2012-04-20 LAB — BASIC METABOLIC PANEL
CO2: 20 mEq/L (ref 19–32)
Calcium: 8 mg/dL — ABNORMAL LOW (ref 8.4–10.5)
Creatinine, Ser: 0.88 mg/dL (ref 0.50–1.35)
Glucose, Bld: 128 mg/dL — ABNORMAL HIGH (ref 70–99)

## 2012-04-20 LAB — GLUCOSE, CAPILLARY
Glucose-Capillary: 126 mg/dL — ABNORMAL HIGH (ref 70–99)
Glucose-Capillary: 105 mg/dL — ABNORMAL HIGH (ref 70–99)
Glucose-Capillary: 140 mg/dL — ABNORMAL HIGH (ref 70–99)
Glucose-Capillary: 117 mg/dL — ABNORMAL HIGH (ref 70–99)
Glucose-Capillary: 135 mg/dL — ABNORMAL HIGH (ref 70–99)
Glucose-Capillary: 178 mg/dL — ABNORMAL HIGH (ref 70–99)

## 2012-04-20 MED ORDER — ACETAMINOPHEN 325 MG PO TABS: 650.0000 mg | ORAL_TABLET | Freq: Four times a day (QID) | ORAL | Status: DC | PRN

## 2012-04-20 MED ORDER — INSULIN ASPART 100 UNIT/ML ~~LOC~~ SOLN
0.0000 [IU] | Freq: Three times a day (TID) | SUBCUTANEOUS | Status: DC
  Administered 2012-04-20: 2 [IU] via SUBCUTANEOUS
  Administered 2012-04-20: 4 [IU] via SUBCUTANEOUS
  Administered 2012-04-20: 8 [IU] via SUBCUTANEOUS
  Administered 2012-04-21 (×2): 4 [IU] via SUBCUTANEOUS
  Administered 2012-04-21: 2 [IU] via SUBCUTANEOUS
  Administered 2012-04-21: 4 [IU] via SUBCUTANEOUS
  Administered 2012-04-22: 2 [IU] via SUBCUTANEOUS
  Administered 2012-04-22: 4 [IU] via SUBCUTANEOUS
  Administered 2012-04-22 – 2012-04-23 (×4): 2 [IU] via SUBCUTANEOUS

## 2012-04-20 MED ORDER — ONDANSETRON HCL 4 MG PO TABS: 4.0000 mg | ORAL_TABLET | Freq: Four times a day (QID) | ORAL | Status: DC | PRN

## 2012-04-20 MED ORDER — SODIUM CHLORIDE 0.9 % IJ SOLN
3.0000 mL | Freq: Two times a day (BID) | INTRAMUSCULAR | Status: DC
  Administered 2012-04-20 – 2012-04-23 (×7): 3 mL via INTRAVENOUS

## 2012-04-20 MED ORDER — MOVING RIGHT ALONG BOOK
Freq: Once | Status: AC
  Administered 2012-04-20: 10:00:00
  Filled 2012-04-20: qty 1

## 2012-04-20 MED ORDER — SODIUM CHLORIDE 0.9 % IV SOLN: 250.0000 mL | INTRAVENOUS | Status: DC | PRN

## 2012-04-20 MED ORDER — SODIUM CHLORIDE 0.9 % IJ SOLN: 3.0000 mL | INTRAMUSCULAR | Status: DC | PRN

## 2012-04-20 MED ORDER — TRAMADOL HCL 50 MG PO TABS
50.0000 mg | ORAL_TABLET | ORAL | Status: DC | PRN
  Administered 2012-04-20: 100 mg via ORAL
  Filled 2012-04-20: qty 2

## 2012-04-20 MED ORDER — BISACODYL 5 MG PO TBEC
10.0000 mg | DELAYED_RELEASE_TABLET | Freq: Every day | ORAL | Status: DC | PRN
  Administered 2012-04-22: 10 mg via ORAL
  Filled 2012-04-20: qty 2

## 2012-04-20 MED ORDER — METOPROLOL TARTRATE 25 MG PO TABS
25.0000 mg | ORAL_TABLET | Freq: Two times a day (BID) | ORAL | Status: DC
  Administered 2012-04-20: 25 mg via ORAL
  Filled 2012-04-20 (×4): qty 1

## 2012-04-20 MED ORDER — FUROSEMIDE 10 MG/ML IJ SOLN
40.0000 mg | Freq: Once | INTRAMUSCULAR | Status: AC
  Administered 2012-04-20: 40 mg via INTRAVENOUS
  Filled 2012-04-20: qty 4

## 2012-04-20 MED ORDER — BISACODYL 10 MG RE SUPP: 10.0000 mg | Freq: Every day | RECTAL | Status: DC | PRN

## 2012-04-20 MED ORDER — INSULIN ASPART 100 UNIT/ML ~~LOC~~ SOLN
0.0000 [IU] | SUBCUTANEOUS | Status: DC
  Administered 2012-04-20: 2 [IU] via SUBCUTANEOUS

## 2012-04-20 MED ORDER — PANTOPRAZOLE SODIUM 40 MG PO TBEC
40.0000 mg | DELAYED_RELEASE_TABLET | Freq: Every day | ORAL | Status: DC
  Administered 2012-04-21 – 2012-04-23 (×3): 40 mg via ORAL
  Filled 2012-04-20 (×3): qty 1

## 2012-04-20 MED ORDER — POTASSIUM CHLORIDE CRYS ER 20 MEQ PO TBCR
40.0000 meq | EXTENDED_RELEASE_TABLET | Freq: Every day | ORAL | Status: AC
  Administered 2012-04-20 – 2012-04-22 (×3): 40 meq via ORAL
  Filled 2012-04-20 (×3): qty 2

## 2012-04-20 MED ORDER — ONDANSETRON HCL 4 MG/2ML IJ SOLN: 4.0000 mg | Freq: Four times a day (QID) | INTRAMUSCULAR | Status: DC | PRN

## 2012-04-20 MED ORDER — OXYCODONE HCL 5 MG PO TABS
5.0000 mg | ORAL_TABLET | ORAL | Status: DC | PRN
  Administered 2012-04-20 – 2012-04-21 (×2): 5 mg via ORAL
  Administered 2012-04-21 (×2): 10 mg via ORAL
  Administered 2012-04-21: 5 mg via ORAL
  Administered 2012-04-22 (×2): 10 mg via ORAL
  Administered 2012-04-22 – 2012-04-23 (×2): 5 mg via ORAL
  Filled 2012-04-20 (×4): qty 1
  Filled 2012-04-20 (×4): qty 2
  Filled 2012-04-20: qty 1

## 2012-04-20 MED ORDER — FUROSEMIDE 40 MG PO TABS
40.0000 mg | ORAL_TABLET | Freq: Every day | ORAL | Status: AC
  Administered 2012-04-21 – 2012-04-22 (×2): 40 mg via ORAL
  Filled 2012-04-20 (×2): qty 1

## 2012-04-20 MED ORDER — ASPIRIN EC 325 MG PO TBEC
325.0000 mg | DELAYED_RELEASE_TABLET | Freq: Every day | ORAL | Status: DC
  Administered 2012-04-20 – 2012-04-23 (×4): 325 mg via ORAL
  Filled 2012-04-20 (×4): qty 1

## 2012-04-20 MED ORDER — DOCUSATE SODIUM 100 MG PO CAPS
200.0000 mg | ORAL_CAPSULE | Freq: Every day | ORAL | Status: DC
  Administered 2012-04-20 – 2012-04-23 (×4): 200 mg via ORAL
  Filled 2012-04-20 (×4): qty 2

## 2012-04-20 NOTE — Progress Notes (Signed)
Monitor tech called to notify me that patient had 7 beats of V-tach. Checked on patient and he was up brushing his teeth. VSS, BP=124/67, HR=81, O2=94% RA. Patient is resting comfortably in his chair. Patient has no complaints of pain. Dr. Laneta Simmers is aware. Lajuana Matte, RN

## 2012-04-20 NOTE — Progress Notes (Signed)
1 Day Post-Op Procedure(s) (LRB): CORONARY ARTERY BYPASS GRAFTING (CABG) (N/A) Subjective: No complaints  Objective: Vital signs in last 24 hours: Temp:  [95 F (35 C)-100 F (37.8 C)] 99.9 F (37.7 C) (05/11 0800) Pulse Rate:  [72-90] 81  (05/11 0800) Cardiac Rhythm:  [-] Normal sinus rhythm (05/11 0800) Resp:  [0-25] 16  (05/11 0800) BP: (94-140)/(49-87) 112/67 mmHg (05/11 0500) SpO2:  [94 %-100 %] 98 % (05/11 0800) Arterial Line BP: (99-154)/(27-58) 135/58 mmHg (05/11 0800) FiO2 (%):  [2 %-50.7 %] 2 % (05/11 0000) Weight:  [89.2 kg (196 lb 10.4 oz)] 89.2 kg (196 lb 10.4 oz) (05/11 0600)  Hemodynamic parameters for last 24 hours: PAP: (15-37)/(1-19) 24/12 mmHg CO:  [4.1 L/min-5.6 L/min] 5.4 L/min CI:  [2 L/min/m2-2.8 L/min/m2] 2.7 L/min/m2  Intake/Output from previous day: 05/10 0701 - 05/11 0700 In: 3619 [I.V.:2078; Blood:461; NG/GT:30; IV Piggyback:1050] Out: 5295 [Urine:3755; Blood:650; Chest Tube:890] Intake/Output this shift: Total I/O In: 60 [I.V.:60] Out: 100 [Urine:100]  General appearance: alert and cooperative Neurologic: intact Heart: regular rate and rhythm, S1, S2 normal, no murmur, click, rub or gallop Lungs: clear to auscultation bilaterally Extremities: edema mild Wound: dressing dry  Lab Results:  Basename 04/20/12 0500 04/19/12 1900  WBC 15.9* 14.9*  HGB 12.9* 12.8*  HCT 36.9* 36.3*  PLT 147* 127*   BMET:  Basename 04/20/12 0500 04/19/12 1900 04/19/12 1859 04/18/12 2018  NA 136 -- 140 --  K 4.2 -- 4.1 --  CL 103 -- 105 --  CO2 20 -- -- 27  GLUCOSE 128* -- 115* --  BUN 10 -- 11 --  CREATININE 0.88 0.87 -- --  CALCIUM 8.0* -- -- 9.7    PT/INR:  Basename 04/19/12 1248  LABPROT 16.2*  INR 1.27   ABG    Component Value Date/Time   PHART 7.315* 04/19/2012 1734   HCO3 22.8 04/19/2012 1734   TCO2 22 04/19/2012 1859   ACIDBASEDEF 3.0* 04/19/2012 1734   O2SAT 99.0 04/19/2012 1734   CBG (last 3)   Basename 04/20/12 0722 04/20/12 0052  04/19/12 2349  GLUCAP 135* 98 111*   CXR: mild left base atelectasis  ECG:  NSR.  No acute changes  Assessment/Plan: S/P Procedure(s) (LRB): CORONARY ARTERY BYPASS GRAFTING (CABG) (N/A) Mobilize Diuresis Diabetes control d/c tubes/lines Plan for transfer to step-down: see transfer orders   LOS: 4 days    [name] K 04/20/2012

## 2012-04-21 ENCOUNTER — Inpatient Hospital Stay (HOSPITAL_COMMUNITY): Payer: BC Managed Care – PPO

## 2012-04-21 LAB — BASIC METABOLIC PANEL
CO2: 26 mEq/L (ref 19–32)
Calcium: 8.6 mg/dL (ref 8.4–10.5)
Glucose, Bld: 159 mg/dL — ABNORMAL HIGH (ref 70–99)
Sodium: 136 mEq/L (ref 135–145)

## 2012-04-21 LAB — CBC
HCT: 33.4 % — ABNORMAL LOW (ref 39.0–52.0)
Hemoglobin: 11.2 g/dL — ABNORMAL LOW (ref 13.0–17.0)
MCH: 31.3 pg (ref 26.0–34.0)
MCV: 93.3 fL (ref 78.0–100.0)
RBC: 3.58 MIL/uL — ABNORMAL LOW (ref 4.22–5.81)

## 2012-04-21 LAB — GLUCOSE, CAPILLARY
Glucose-Capillary: 77 mg/dL (ref 70–99)
Glucose-Capillary: 146 mg/dL — ABNORMAL HIGH (ref 70–99)
Glucose-Capillary: 177 mg/dL — ABNORMAL HIGH (ref 70–99)

## 2012-04-21 MED ORDER — METOPROLOL TARTRATE 25 MG PO TABS
37.5000 mg | ORAL_TABLET | Freq: Two times a day (BID) | ORAL | Status: DC
  Administered 2012-04-21 – 2012-04-23 (×5): 37.5 mg via ORAL
  Filled 2012-04-21 (×6): qty 1

## 2012-04-21 MED ORDER — METFORMIN HCL 500 MG PO TABS
1000.0000 mg | ORAL_TABLET | Freq: Every evening | ORAL | Status: DC
  Administered 2012-04-21 – 2012-04-22 (×2): 1000 mg via ORAL
  Filled 2012-04-21 (×3): qty 2

## 2012-04-21 NOTE — Progress Notes (Addendum)
[name]            [place] [address]          [phone]     2 Days Post-Op  Procedure(s) (LRB): CORONARY ARTERY BYPASS GRAFTING (CABG) (N/A) Subjective:  some incisional discomfort  Objective  Telemetry SR, short bursts of SVT   Temp:  [98.9 F (37.2 C)-99.4 F (37.4 C)] 99.4 F (37.4 C) (05/12 0621) Pulse Rate:  [75-92] 90  (05/12 0621) Resp:  [4-21] 19  (05/12 0621) BP: (100-124)/(55-69) 112/66 mmHg (05/12 0621) SpO2:  [90 %-98 %] 90 % (05/12 0621) Arterial Line BP: (116)/(62) 116/62 mmHg (05/11 0900) Weight:  [191 lb 9.3 oz (86.9 kg)] 191 lb 9.3 oz (86.9 kg) (05/12 0621)   Intake/Output Summary (Last 24 hours) at 04/21/12 0821 Last data filed at 04/21/12 0700  Gross per 24 hour  Intake    524 ml  Output   1325 ml  Net   -801 ml       General appearance: alert, cooperative and no distress Heart: regular rate and rhythm and S1, S2 normal Lungs: clear to auscultation bilaterally Abdomen: soft, nontender Extremities: trace edema Wound: incisions healing well  Lab Results:  Basename 04/21/12 0652 04/20/12 0500 04/19/12 1900  NA 136 136 --  K 4.5 4.2 --  CL 99 103 --  CO2 26 20 --  GLUCOSE 159* 128* --  BUN 16 10 --  CREATININE 1.00 0.88 --  CALCIUM 8.6 8.0* --  MG -- 2.1 2.6*  PHOS -- -- --    Basename 04/18/12 2018  AST 31  ALT 35  ALKPHOS 64  BILITOT 0.5  PROT 6.8  ALBUMIN 4.3   No results found for this basename: LIPASE:2,AMYLASE:2 in the last 72 hours  Basename 04/21/12 0652 04/20/12 0500  WBC 12.8* 15.9*  NEUTROABS -- --  HGB 11.2* 12.9*  HCT 33.4* 36.9*  MCV 93.3 91.1  PLT 140* 147*   No results found for this basename: CKTOTAL:4,CKMB:4,TROPONINI:4 in the last 72 hours No components found with this basename: POCBNP:3 No results found for this basename: DDIMER in the last 72 hours No results found for this basename: HGBA1C in the last 72 hours No results found for this basename:  CHOL,HDL,LDLCALC,TRIG,CHOLHDL in the last 72 hours No results found for this basename: TSH,T4TOTAL,FREET3,T3FREE,THYROIDAB in the last 72 hours No results found for this basename: VITAMINB12,FOLATE,FERRITIN,TIBC,IRON,RETICCTPCT in the last 72 hours  Medications: Scheduled    . allopurinol  100 mg Oral Daily  . aspirin EC  325 mg Oral Daily  . atorvastatin  40 mg Oral q1800  . docusate sodium  200 mg Oral Daily  . furosemide  40 mg Intravenous Once  . furosemide  40 mg Oral Daily  . insulin aspart  0-24 Units Subcutaneous TID AC & HS  . metoprolol tartrate  25 mg Oral BID  . moving right along book   Does not apply Once  . pantoprazole  40 mg Oral QAC breakfast  . potassium chloride  40 mEq Oral Daily  . sodium chloride  3 mL Intravenous Q12H  . DISCONTD: acetaminophen (TYLENOL) oral liquid 160 mg/5 mL  975 mg Per Tube Q6H  . DISCONTD: acetaminophen  1,000 mg Oral Q6H  . DISCONTD: aspirin  324 mg Per Tube Daily  . DISCONTD: aspirin EC  325 mg Oral Daily  . DISCONTD: bisacodyl  10  mg Oral Daily  . DISCONTD: bisacodyl  10 mg Rectal Daily  . DISCONTD: cefUROXime (ZINACEF)  IV  1.5 g Intravenous Q12H  . DISCONTD: docusate sodium  200 mg Oral Daily  . DISCONTD: famotidine (PEPCID) IV  20 mg Intravenous Q12H  . DISCONTD: insulin aspart  0-24 Units Subcutaneous Q2H  . DISCONTD: insulin aspart  0-24 Units Subcutaneous Q4H  . DISCONTD: metoprolol tartrate  12.5 mg Per Tube BID  . DISCONTD: metoprolol tartrate  12.5 mg Oral BID  . DISCONTD: pantoprazole  40 mg Oral Q1200  . DISCONTD: sodium chloride  3 mL Intravenous Q12H     Radiology/Studies:  Dg Chest Portable 1 View In Am  04/20/2012  *RADIOLOGY REPORT*  Clinical Data: Status post CABG.  PORTABLE CHEST - 1 VIEW  Comparison: 04/19/2012  Findings: Endotracheal tube has been removed.  Left chest tube remains without pneumothorax.  Swan-Ganz catheter tip in main pulmonary artery.  Mediastinal drains stable in appearance.  Lungs show  minimal atelectasis of the left lower lobe.  No edema or significant pleural effusions.  Heart size and mediastinal contours are stable.  IMPRESSION: No pneumothorax.  Minimal left lower lobe atelectasis.  Original Report Authenticated By: Reola Calkins, M.D.   Dg Chest Portable 1 View  04/19/2012  *RADIOLOGY REPORT*  Clinical Data: Post CABG  PORTABLE CHEST - 1 VIEW  Comparison: Portable exam 1310 hours compared to 04/18/2012  Findings: Tip of endotracheal tube 5.7 cm above carina. Nasogastric tube extends into stomach. Mediastinal drains and left thoracostomy tube present. Right jugular Swan-Ganz catheter, tip at distal main pulmonary artery. Minimal enlargement of cardiac silhouette post interval CABG. Slight pulmonary vascular congestion. Edema versus atelectasis left lower lobe. No pleural effusion or pneumothorax. Tip of right lung apex excluded.  IMPRESSION: Postoperative changes as above.  Original Report Authenticated By: Lollie Marrow, M.D.    INR: Will add last result for INR, ABG once components are confirmed Will add last 4 CBG results once components are confirmed  Assessment/Plan: S/P Procedure(s) (LRB): CORONARY ARTERY BYPASS GRAFTING (CABG) (N/A)  1 Doing well 2 push rehab/pulm toilet 3 gentle diuresis 4 increase lopressor a little 5 monitor H/H- reasonably stable 36-33 6 encouraged pain meds to keep more comfortable 7 cont ssi- restart metformin  LOS: 5 days    [name],[name] 5/12/20138:21 AM     Chart reviewed, patient examined, agree with above.

## 2012-04-21 NOTE — Progress Notes (Signed)
Pt ambulated 550 ft with wife. Tolerated well. Alexander Mt.N.

## 2012-04-22 ENCOUNTER — Encounter (HOSPITAL_COMMUNITY): Payer: Self-pay | Admitting: Surgery

## 2012-04-22 LAB — GLUCOSE, CAPILLARY
Glucose-Capillary: 193 mg/dL — ABNORMAL HIGH (ref 70–99)
Glucose-Capillary: 146 mg/dL — ABNORMAL HIGH (ref 70–99)

## 2012-04-22 MED ORDER — POTASSIUM CHLORIDE CRYS ER 20 MEQ PO TBCR: 20.0000 meq | EXTENDED_RELEASE_TABLET | Freq: Once | ORAL | Status: DC

## 2012-04-22 MED ORDER — ATORVASTATIN CALCIUM 40 MG PO TABS: 40.0000 mg | ORAL_TABLET | Freq: Every day | ORAL | Status: DC

## 2012-04-22 MED ORDER — METOPROLOL TARTRATE 25 MG PO TABS: 37.5000 mg | ORAL_TABLET | Freq: Two times a day (BID) | ORAL | Status: DC

## 2012-04-22 MED ORDER — OXYCODONE HCL 5 MG PO TABS: 5.0000 mg | ORAL_TABLET | ORAL | Status: AC | PRN

## 2012-04-22 MED ORDER — ASPIRIN 325 MG PO TBEC: 325.0000 mg | DELAYED_RELEASE_TABLET | Freq: Every day | ORAL | Status: AC

## 2012-04-22 MED ORDER — FUROSEMIDE 40 MG PO TABS
40.0000 mg | ORAL_TABLET | Freq: Once | ORAL | Status: AC
  Administered 2012-04-22: 40 mg via ORAL
  Filled 2012-04-22: qty 1

## 2012-04-22 MED FILL — Potassium Chloride Inj 2 mEq/ML: INTRAVENOUS | Qty: 40 | Status: AC

## 2012-04-22 MED FILL — Magnesium Sulfate Inj 50%: INTRAMUSCULAR | Qty: 10 | Status: AC

## 2012-04-22 NOTE — Progress Notes (Signed)
Pt ambulated 300 ft by self. Tolrrated well. Alexander Mt.N.

## 2012-04-22 NOTE — Progress Notes (Signed)
CARDIAC REHAB PHASE I   PRE:  Rate/Rhythm: 78 SR    BP: sitting 121/70    SaO2: 94 RA  MODE:  Ambulation: 890 ft   POST:  Rate/Rhythm: 84 SR    BP: sitting 119/67     SaO2: 93 RA  Tolerated very well without assist of RW. Feels good, no c/o. Encouraged sternum splinting when coughing and IS. To recliner. Will f/u. [phone]  Harriet Masson CES, ACSM

## 2012-04-22 NOTE — Op Note (Signed)
NAME:  [name], [name] NO.:  [id number]  MEDICAL RECORD NO.:  [id number]  LOCATION:                                 FACILITY:  PHYSICIAN:  Evelene Croon, M.D.     DATE OF BIRTH:  22-Oct-1946  DATE OF PROCEDURE:  04/19/2012 DATE OF DISCHARGE:                              OPERATIVE REPORT   PREOPERATIVE DIAGNOSIS:  Severe multivessel coronary artery disease with crescendo angina.  POSTOPERATIVE DIAGNOSIS:  Severe multivessel coronary artery disease with crescendo angina.  OPERATIVE PROCEDURE:  Median sternotomy, extracorporeal circulation, coronary artery bypass graft surgery x3 using a left internal mammary artery graft to left anterior descending coronary artery, with a saphenous vein graft to the distal left circumflex coronary artery, and a saphenous vein graft to the posterior descending branch of the right coronary artery.  Endoscopic vein harvesting from the right leg.  ATTENDING SURGEON:  Evelene Croon, M.D.  ASSISTANT:  Coral Ceo, PA-C.  ANESTHESIA:  General endotracheal.  CLINICAL HISTORY:  This patient is a 66 year old gentleman with a history of type 2 diabetes, who presented with a several-month history of exertional burning in his throat, associated with fatigue and dyspnea.  His symptoms have typically occurred when he was running and were relieved with rest.  Nuclear stress test showed ejection fraction of 54% with diffuse ST-segment depression with exercise.  Cardiac catheterization showed severe multivessel coronary artery disease.  The LAD had 90% proximal stenosis.  There are 2 small diagonal branches. The left circumflex was occluded after the large first marginal branch, with filling of the distal vessel by collaterals.  The right coronary artery had a proximal 95% stenosis as well as a distal 60% stenosis before the bifurcation of the posterior descending branch.  This was a dominant vessel.  Left ventricular ejection fraction about  55%.  After review of the catheterization and examination of the patient, it was felt that coronary artery bypass graft surgery is the best treatment to prevent further ischemia and infarction and improve his quality of life in this very active gentleman.  I discussed the operative procedure with the patient and his wife including alternatives, benefits, and risks including, but not limited to, bleeding, blood transfusion, infection, stroke, myocardial infarction, graft failure, and death.  He understood and agreed to proceed.  PROCEDURE:  The patient was taken to the operating room and placed on table in supine position.  After induction of general endotracheal anesthesia, a Foley catheter was placed in bladder using sterile technique.  Then, the chest, abdomen, and both lower extremities were prepped and draped in usual sterile manner.  Then the chest was opened through a median sternotomy incision.  The pericardium was opened in midline.  Examination of the heart showed good ventricular contractility.  The ascending aorta was of normal size, had no palpable plaques in it.  Then, the left internal mammary artery was harvested from chest wall as pedicle graft.  This was a medium caliber vessel with excellent blood flow through it.  At the same time, segment of greater saphenous vein was harvested from the right leg using endoscopic vein harvest technique.  This vein was of medium size and good  quality.  Then the patient was heparinized, when an adequate ACT was obtained, the distal ascending aorta was cannulated using a 20-French aortic cannula for arterial inflow.  Venous outflow was achieved using a two-stage venous cannula for the right atrial appendage.  An antegrade cardioplegia and vent cannula was inserted in the aortic root.  The patient placed on cardiopulmonary bypass and distal coronaries were identified.  The LAD was a large graftable vessel.  The diagonal branches were  tiny.  The first obtuse marginal branch was a large bifurcating vessel that had no significant disease noted on angiogram. There was a small second marginal and then the third marginal was a medium-sized vessel that was suitable for grafting and it was completely occluded on angiogram.  The right coronary artery was a large vessel. The posterior descending branch was a large vessel with no significant disease in it.  The posterolateral branch was a moderate-sized vessel. There did not appear to be any significant plaque between the posterior descending and posterolateral branches.  Then, the aorta was crossclamped and 800 mL of cold blood antegrade cardioplegia was administered in the aortic root with quick arrest of the heart.  Systemic hypothermia to 32 degrees centigrade and topical hypothermic iced saline was used.  Temperature probe was placed in septum and insulating pad in the pericardium.  The first distal anastomosis was performed to the posterior descending coronary artery.  The internal diameter of this vessel proximally was about 2 mm.  A 1.5-mm probe was passed easily backwards into the main body of the right coronary artery, and there not appear to be any obstruction there and therefore only the posterior descending branch was grafted.  The conduit used was a segment of greater saphenous vein and the anastomosis was performed in an end-to-side manner using continuous 7-0 Prolene suture.  Flow was noted through the graft and was excellent.  The second distal anastomosis was performed to the distal left circumflex coronary artery.  The internal diameter of this vessel was about 1.5 mm.  The conduit used was a second segment of greater saphenous vein and anastomosis performed in an end-to-side manner using continuous 7-0 suture.  Flow was noted through the graft and was excellent.  Another dose of cardioplegia was given and then the third distal anastomosis was performed  to the mid LAD.  The internal diameter of this vessel was 1.75 mm.  Conduit used was the left internal mammary graft, was brought through an opening in the left pericardium anterior to the phrenic nerve.  This was anastomosed to LAD in end-to-side manner using continuous 8-0 Prolene suture.  The pedicle was sutured to the epicardium with 6-0 Prolene sutures.  Then, with a crossclamp in place, two proximal vein graft anastomoses were performed to the mid ascending aorta in an end-to-side manner using continuous 6-0 Prolene suture. Then, the clamp was removed from the mammary pedicle.  There was rapid warming of the ventricular septum and return of spontaneous ventricular fibrillation.  Crossclamp was removed at the time of 77 minutes and the patient spontaneously converted to sinus rhythm.  The proximal and distal anastomoses appeared hemostatic and allowed the grafts satisfactory.  Graft markers were placed around the proximal anastomoses.  Two temporary right ventricular and right atrial pacing wires placed and brought through the skin.  When the patient rewarmed to 37 degrees centigrade, he was weaned from cardiopulmonary bypass on no inotropic agents.  Total bypass time was 95 minutes.  Cardiac function appeared excellent with  the cardiac output of 5 L per minute.  Protamine was given, and the venous and aortic cannulae were without difficulty.  Hemostasis was achieved.  Three chest tubes were placed with tube in the posterior pericardium, one in left pleural space, and one in the anterior mediastinum.  The sternum was then reapproximated with double #6 stainless steel wires.  The fascia was closed with continuous 1 Vicryl suture.  Subcutaneous tissue was closed with continuous 2-0 Vicryl and skin with a 3-0 Vicryl subcuticular closure.  The lower extremity vein harvest site was closed in layers in similar manner. The sponge, needle, instrument counts were correct according to  the scrub nurse.  Dry sterile dressings were applied over the incisions around the chest tubes, which were hooked to Pleur-Evac suction.  The patient remained hemodynamically stable and was transferred to the SICU in guarded, but stable condition.     Evelene Croon, M.D.     BB/MEDQ  D:  04/19/2012  T:  04/19/2012  Job:  [id number]

## 2012-04-22 NOTE — Discharge Instructions (Signed)
Activity: 1.May walk up steps                2.No lifting more than ten pounds for four weeks.                 3.No driving for four weeks.                4.Stop any activity that causes chest pain, shortness of breath, dizziness, sweating or excessive weakness.                5.Avoid straining.                6.Continue with your breathing exercises daily.  Diet: Diabetic diet and Low fat, Low salt diet  Wound Care: May shower.  Clean wounds with mild soap and water daily. Contact the office at [phone] if any problems arise.  Coronary Artery Bypass Grafting Care After Refer to this sheet in the next few weeks. These instructions provide you with information on caring for yourself after your procedure. Your caregiver may also give you more specific instructions. Your treatment has been planned according to current medical practices, but problems sometimes occur. Call your caregiver if you have any problems or questions after your procedure.  Recovery from open heart surgery will be different for everyone. Some people feel well after 3 or 4 weeks, while for others it takes longer. After heart surgery, it may be normal to:  Not have an appetite, feel nauseated by the smell of food, or only want to eat a small amount.   Be constipated because of changes in your diet, activity, and medicines. Eat foods high in fiber. Add fresh fruits and vegetables to your diet. Stool softeners may be helpful.   Feel sad or unhappy. You may be frustrated or cranky. You may have good days and bad days. Do not give up. Talk to your caregiver if you do not feel better.   Feel weakness and fatigue. You many need physical therapy or cardiac rehabilitation to get your strength back.   Develop an irregular heartbeat called atrial fibrillation. Symptoms of atrial fibrillation are a fast, irregular heartbeat or feelings of fluttery heartbeats, shortness of breath, low blood pressure, and dizziness. If these symptoms  develop, see your caregiver right away.  MEDICATION  Have a list of all the medicines you will be taking when you leave the hospital. For every medicine, know the following:   Name.   Exact dose.   Time of day to be taken.   How often it should be taken.   Why you are taking it.   Ask which medicines should or should not be taken together. If you take more than one heart medicine, ask if it is okay to take them together. Some heart medicines should not be taken at the same time because they may lower your blood pressure too much.   Narcotic pain medicine can cause constipation. Eat fresh fruits and vegetables. Add fiber to your diet. Stool softener medicine may help relieve constipation.   Keep a copy of your medicines with you at all times.   Do not add or stop taking any medicine until you check with your caregiver.   Medicines can have side effects. Call your caregiver who prescribed the medicine if you:   Start throwing up, have diarrhea, or have stomach pain.   Feel dizzy or lightheaded when you stand up.   Feel your heart is skipping beats or is   beating too fast or too slow.   Develop a rash.   Notice unusual bruising or bleeding.  HOME CARE INSTRUCTIONS  After heart surgery, it is important to learn how to take your pulse. Have your caregiver show you how to take your pulse.   Use your incentive spirometer. Ask your caregiver how long after surgery you need to use it.  Care of your chest incision  Tell your caregiver right away if you notice clicking in your chest (sternum).   Support your chest with a pillow or your arms when you take deep breaths and cough.   Follow your caregiver's instructions about when you can bathe or swim.   Protect your incision from sunlight during the first year to keep the scar from getting dark.   Tell your caregiver if you notice:   Increased tenderness of your incision.   Increased redness or swelling around your incision.     Drainage or pus from your incision.  Care of your leg incision(s)  Avoid crossing your legs.   Avoid sitting for long periods of time. Change positions every half hour.   Elevate your leg(s) when you are sitting.   Check your leg(s) daily for swelling. Check the incisions for redness or drainage.   Wear your elastic stockings as told by your caregiver. Take them off at bedtime.  Diet  Diet is very important to heart health.   Eat plenty of fresh fruits and vegetables. Meats should be lean cut. Avoid canned, processed, and fried foods.   Talk to a dietician. They can teach you how to make healthy food and drink choices.  Weight  Weigh yourself every day. This is important because it helps to know if you are retaining fluid that may make your heart and lungs work harder.   Use the same scale each time.   Weigh yourself every morning at the same time. You should do this after you go to the bathroom, but before you eat breakfast.   Your weight will be more accurate if you do not wear any clothes.   Record your weight.   Tell your caregiver if you have gained 2 pounds or more overnight.  Activity Stop any activity at once if you have chest pain, shortness of breath, irregular heartbeats, or dizziness. Get help right away if you have any of these symptoms.  Bathing.  Avoid soaking in a bath or hot tub until your incisions are healed.   Rest. You need a balance of rest and activity.   Exercise. Exercise per your caregiver's advice. You may need physical therapy or cardiac rehabilitation to help strengthen your muscles and build your endurance.   Climbing stairs. Unless your caregiver tells you not to climb stairs, go up stairs slowly and rest if you tire. Do not pull yourself up by the handrail.   Driving a car. Follow your caregiver's advice on when you may drive. You may ride as a passenger at any time. When traveling for long periods of time in a car, get out of the car and  walk around for a few minutes every 2 hours.   Lifting. Avoid lifting, pushing, or pulling anything heavier than 10 pounds for 6 weeks after surgery or as told by your caregiver.   Returning to work. Check with your caregiver. People heal at different rates. Most people will be able to go back to work 6 to 12 weeks after surgery.   Sexual activity. You may resume sexual relations   as told by your caregiver.  SEEK MEDICAL CARE IF:  Any of your incisions are red, painful, or have any type of drainage coming from them.   You have an oral temperature above 102 F (38.9 C).   You have ankle or leg swelling.   You have pain in your legs.   You have weight gain of 2 or more pounds a day.   You feel dizzy or lightheaded when you stand up.  SEEK IMMEDIATE MEDICAL CARE IF:  You have angina or chest pain that goes to your jaw or arms. Call your local emergency services right away.   You have shortness of breath at rest or with activity.   You have a fast or irregular heartbeat (arrhythmia).   There is a "clicking" in your sternum when you move.   You have numbness or weakness in your arms or legs.  MAKE SURE YOU:  Understand these instructions.   Will watch your condition.   Will get help right away if you are not doing well or get worse.  Document Released: 06/16/2005 Document Revised: 11/16/2011 Document Reviewed: 02/01/2011 [employer] Patient Information 2012 [employer].    

## 2012-04-22 NOTE — Progress Notes (Addendum)
3 Days Post-Op Procedure(s) (LRB): CORONARY ARTERY BYPASS GRAFTING (CABG) (N/A)  Subjective: Patient passing flatus but no bm yet.  Objective: Vital signs in last 24 hours: Patient Vitals for the past 24 hrs:  BP Temp Temp src Pulse Resp SpO2 Weight  04/22/12 0634 - - - - - - 191 lb 2.2 oz (86.7 kg)  04/22/12 0504 104/64 mmHg 99.4 F (37.4 C) Oral 73  18  90 % -  04/21/12 2108 107/62 mmHg 99 F (37.2 C) Oral 82  18  93 % -  04/21/12 1835 103/59 mmHg 99.3 F (37.4 C) Oral 86  - - -  04/21/12 1300 98/56 mmHg 99.6 F (37.6 C) Oral 80  18  92 % -  04/21/12 1125 111/68 mmHg - - 84  18  - -   Pre op weight  88 kg Current Weight  04/22/12 191 lb 2.2 oz (86.7 kg)      Intake/Output from previous day: 05/12 0701 - 05/13 0700 In: 480 [P.O.:480] Out: 900 [Urine:900]   Physical Exam:  Cardiovascular: RRR, no murmurs, gallops, or rubs. Pulmonary: Mostly clear to auscultation bilaterally; no rales, wheezes, or rhonchi. Abdomen: Soft, non tender, bowel sounds present. Extremities: Mild bilateral lower extremity edema. Wounds: Clean and dry.  No erythema or signs of infection.  Lab Results: CBC: Basename 04/21/12 0652 04/20/12 0500  WBC 12.8* 15.9*  HGB 11.2* 12.9*  HCT 33.4* 36.9*  PLT 140* 147*   BMET:  Basename 04/21/12 0652 04/20/12 0500  NA 136 136  K 4.5 4.2  CL 99 103  CO2 26 20  GLUCOSE 159* 128*  BUN 16 10  CREATININE 1.00 0.88  CALCIUM 8.6 8.0*    PT/INR:  Basename 04/19/12 1248  LABPROT 16.2*  INR 1.27   ABG:  INR: Will add last result for INR, ABG once components are confirmed Will add last 4 CBG results once components are confirmed  Assessment/Plan:  1. CV - SR.Continue Lopressor 37.5 bid. 2.  Pulmonary - Encourage incentive spirometer. 3.  Acute blood loss anemia - Last H/H 11.2/33.4. 4.Mild thrombocytopenia-Platelets 140,000. 5.DM-CBGs 155/183/147.Pre op HGA1C 7.Continue with Metformin and Insulin PRN. 6.Remove EPW in am. 7.Possible  discharge 1-2 days.    ZIMMERMAN,DONIELLE MPA-C 04/22/2012    Chart reviewed, patient examined, agree with above. He can go home in am if no changes.

## 2012-04-22 NOTE — Progress Notes (Addendum)
Subjective:  Doing well post bypass. His daughter is present in the room. Denies any chest pain, shortness of breath.  Objective:  Vital Signs in the last 24 hours: Temp:  [99 F (37.2 C)-99.4 F (37.4 C)] 99.4 F (37.4 C) (05/13 0504) Pulse Rate:  [73-86] 73  (05/13 0504) Resp:  [18] 18  (05/13 0504) BP: (103-107)/(59-64) 104/64 mmHg (05/13 0504) SpO2:  [90 %-93 %] 90 % (05/13 0504) Weight:  [86.7 kg (191 lb 2.2 oz)] 86.7 kg (191 lb 2.2 oz) (05/13 0634)  Intake/Output from previous day: 05/12 0701 - 05/13 0700 In: 480 [P.O.:480] Out: 900 [Urine:900]   Physical Exam: General: Well developed, well nourished, in no acute distress. Head:  Normocephalic and atraumatic. Lungs: Clear to auscultation and percussion. Heart: Normal S1 and S2.  No murmur, rubs or gallops. Chest scar well healing.  Pulses: Pulses normal in all 4 extremities. Abdomen: soft, non-tender, positive bowel sounds. Extremities: No clubbing or cyanosis. No edema. Neurologic: Alert and oriented x 3.    Lab Results:  Basename 04/21/12 0652 04/20/12 0500  WBC 12.8* 15.9*  HGB 11.2* 12.9*  PLT 140* 147*    Basename 04/21/12 0652 04/20/12 0500  NA 136 136  K 4.5 4.2  CL 99 103  CO2 26 20  GLUCOSE 159* 128*  BUN 16 10  CREATININE 1.00 0.88    Assessment/Plan:  Principal Problem:  *Intermediate coronary syndrome Active Problems:  Coronary atherosclerosis of native coronary artery  Type II or unspecified type diabetes mellitus without mention of complication, not stated as uncontrolled  NSVT  - Brief episode of nonsustained ventricular tachycardia seen yesterday in the evening hours. Transient.Continuing with metoprolol. No further occurrence.Asymptomatic.  CAD  - Doing well post bypass.  - Cardiac rehabilitation. Aspirin, statin.  Diabetes  - Metformin Linette Gunderson 04/22/2012, 1:27 PM

## 2012-04-22 NOTE — Discharge Summary (Signed)
Physician Discharge Summary  Patient ID: [name] MRN: [id number] DOB/AGE: 1946/07/26 66 y.o.  Admit date: 04/16/2012 Discharge date: 04/23/2012  Admission Diagnoses: 1.Multivessel CAD 2.History of DM  Discharge Diagnoses:  1.Multivessel CAD 2.History of DM 3.ABL anemia 4.Mild thrombocytopenia  Procedure (s):  1.Cardiac catheterization done by Dr. Anne Fu on 04/16/2012:  Left main: Minor luminal irregularities of the left main artery, branches into the LAD and circumflex.  Left anterior descending (LAD): Proximal LAD stenosis of 90%. 2 small sized diagonal vessels. The remainder of the LAD has minor luminal irregularities. The 90% stenosis proximally is at the bifurcation of the first septal branch and diagonal branch.  Circumflex artery (CIRC): The AV groove circumflex is 100% occluded in the midsegment with left to right collaterals retrograde filling the vessel. The second obtuse marginal branch is small in caliber, sluggish flow. The first, large obtuse marginal branch has minor luminal irregularities and encompasses a large portion of the lateral wall distribution.  Right coronary artery (RCA): Proximal 95% stenosis as well as distal 60% stenosis before the bifurcation of the PDA. This is the dominant vessel.  LEFT VENTRICULOGRAM: Left ventricular angiogram was done in the 30 RAO projection and revealed normal left ventricular wall motion and systolic function with an estimated ejection fraction of 55%.   2.CORONARY ARTERY BYPASS GRAFTING (CABG) x 3 (LIMA-LAD, SVG-dCx, SVG- PD), EVH right leg by Dr. Laneta Simmers on 04/19/2012.  History of Presenting Illness: This is a 66 year old Caucasian male who reported a several month history of exertional burning in his  Throat, associated with fatigue and dyspnea. He is a very active runner but has been getting symptoms within 6 minutes of starting a run and the symptoms were relieved with rest. Nuclear stress test showed EF of 54% with diffuse ST  segment depression with exercise. Cardiac catheterization done on 04/16/2012 showed severe multivessel CAD and an EF of 55%. A cardiothoracic consultation was obtained with Dr. Laneta Simmers for the consideration of coronary artery bypass grafting surgery. Potential risks, benefits, and complications were discussed with the patient and he agreed to proceed.He underwent a CABG x 3 on 04/19/2012.   Brief Hospital Course:  He was extubated without difficulty the evening of surgery.His Theone Murdoch, a line, chest tubes, and foley were all removed early in his post operative course.He was weaned off his insulin drip and restarted on his metformin as he had taken pre op. His glucose remained under good control. His pre op HGA1C was 7. He was started on a beta blocker.He was found to have mild ABL anemia. His H/H went as low 11.2/33.4. He did not require a transfusion. He was also found to have mild thrombocytopenia.His last platelet count 140,000.He was felt surgically stable for transfer from the ICU to [facility] for further convalescence on 04/21/2012. He has already been tolerating a diet. He has been given a laxative of choice to assist with constipation. Epicardial pacing wires will be removed in the am. Provided he remains afebrile, hemodynamically stable, and pending morning round evaluation, he will be surgically stable for discharge on 04/23/2012.  Latest Vital Signs: Blood pressure 104/64, pulse 73, temperature 99.4 F (37.4 C), temperature source Oral, resp. rate 18, height 5\' 8"  (1.727 m), weight 191 lb 2.2 oz (86.7 kg), SpO2 90.00%.  Physical Exam: Cardiovascular: RRR, no murmurs, gallops, or rubs.  Pulmonary: Mostly clear to auscultation bilaterally; no rales, wheezes, or rhonchi.  Abdomen: Soft, non tender, bowel sounds present.  Extremities: Mild bilateral lower extremity edema.  Wounds: Clean and dry. No erythema or signs of infection.   Discharge Condition:Stable  Recent laboratory studies:  Lab  Results  Component Value Date   WBC 12.8* 04/21/2012   HGB 11.2* 04/21/2012   HCT 33.4* 04/21/2012   MCV 93.3 04/21/2012   PLT 140* 04/21/2012   Lab Results  Component Value Date   NA 136 04/21/2012   K 4.5 04/21/2012   CL 99 04/21/2012   CO2 26 04/21/2012   CREATININE 1.00 04/21/2012   GLUCOSE 159* 04/21/2012      Diagnostic Studies: Dg Chest 2 View  04/21/2012  *RADIOLOGY REPORT*  Clinical Data: CABG  CHEST - 2 VIEW  Comparison: 04/20/2012  Findings: Cardiomediastinal silhouette is stable.  Right Swan-Ganz catheter has been removed.  Status post CABG.  Left chest tube and mediastinal drain has been removed.  No acute infiltrate or pulmonary edema.  No diagnostic pneumothorax. Small left pleural effusion with left basilar atelectasis.  IMPRESSION: Status post CABG.  Swan-Ganz catheter has been removed.  No diagnostic pneumothorax. Small left pleural effusion with left basilar atelectasis.  Original Report Authenticated By: Natasha Mead, M.D.   Discharge Orders    Future Appointments: Provider: Department: Dept Phone: Center:   05/14/2012 12:15 PM Alleen Borne, MD [facility] [phone] [facility]      Discharge Medications: Medication List  As of 04/22/2012 10:41 AM   TAKE these medications         allopurinol 100 MG tablet   Commonly known as: ZYLOPRIM   Take 100 mg by mouth daily.      aspirin 325 MG EC tablet   Take 1 tablet (325 mg total) by mouth daily.      atorvastatin 40 MG tablet   Commonly known as: LIPITOR   Take 1 tablet (40 mg total) by mouth daily at 6 PM.      metFORMIN 500 MG tablet   Commonly known as: GLUCOPHAGE   Take 1,000 mg by mouth every evening.      metoprolol tartrate 25 MG tablet   Commonly known as: LOPRESSOR   Take 1.5 tablets (37.5 mg total) by mouth 2 (two) times daily.      oxyCODONE 5 MG immediate release tablet   Commonly known as: Oxy IR/ROXICODONE   Take 1-2 tablets (5-10 mg total) by mouth every 3 (three) hours as needed for pain.              Follow Up Appointments: Follow-up Information    Follow up with Alleen Borne, MD. (PA/LAT CXR to be taken on 05/14/2012 at 11:15 am;Appointment with Dr. Laneta Simmers is on 05/14/2012 at 12:15 pm)    Contact information:   [address] [place] [address] [phone]       Follow up with Donato Schultz, MD. (Call for a follow up appointment for 2 weeks)    Contact information:   [address] [place] [address] [phone]          SignedDoree Fudge MPA-C 04/22/2012, 10:41 AM

## 2012-04-22 NOTE — Progress Notes (Signed)
Pt ambulated for 3rd time today with daughter. Ambulated approx. 300 feet and tolerated well.

## 2012-04-23 LAB — GLUCOSE, CAPILLARY: Glucose-Capillary: 154 mg/dL — ABNORMAL HIGH (ref 70–99)

## 2012-04-23 MED ORDER — ATORVASTATIN CALCIUM 40 MG PO TABS: 40.0000 mg | ORAL_TABLET | Freq: Every day | ORAL | Status: DC

## 2012-04-23 MED FILL — Heparin Sodium (Porcine) Inj 1000 Unit/ML: INTRAMUSCULAR | Qty: 30 | Status: AC

## 2012-04-23 MED FILL — Lidocaine HCl IV Inj 20 MG/ML: INTRAVENOUS | Qty: 5 | Status: AC

## 2012-04-23 MED FILL — Sodium Chloride Irrigation Soln 0.9%: Qty: 3000 | Status: AC

## 2012-04-23 MED FILL — Sodium Bicarbonate IV Soln 8.4%: INTRAVENOUS | Qty: 50 | Status: AC

## 2012-04-23 MED FILL — Heparin Sodium (Porcine) Inj 1000 Unit/ML: INTRAMUSCULAR | Qty: 10 | Status: AC

## 2012-04-23 MED FILL — Sodium Chloride IV Soln 0.9%: INTRAVENOUS | Qty: 1000 | Status: AC

## 2012-04-23 MED FILL — Electrolyte-R (PH 7.4) Solution: INTRAVENOUS | Qty: 3000 | Status: AC

## 2012-04-23 MED FILL — Mannitol IV Soln 20%: INTRAVENOUS | Qty: 500 | Status: AC

## 2012-04-23 NOTE — Progress Notes (Signed)
Discharge instructions given to pt along with prescriptions. Pt verbalized understanding. Removed pt's IV, no bleeding noted. Pt tolerated well. Pt ready for discharge

## 2012-04-23 NOTE — Progress Notes (Signed)
Pt ambulated approx 700 feet without rolling walker. Pt tolerated well and ready to go home

## 2012-04-23 NOTE — Progress Notes (Addendum)
4 Days Post-Op Procedure(s) (LRB): CORONARY ARTERY BYPASS GRAFTING (CABG) (N/A)  Subjective: Patient about to eat breakfast. Had 2 bowel movements.Has no complaints. Hopes to ho home today.  Objective: Vital signs in last 24 hours: Patient Vitals for the past 24 hrs:  BP Temp Temp src Pulse Resp SpO2 Weight  04/23/12 0608 95/53 mmHg - - - - - -  04/23/12 0605 87/56 mmHg 99.3 F (37.4 C) Oral 61  18  93 % 190 lb 0.6 oz (86.2 kg)  04/22/12 2146 108/58 mmHg 98.6 F (37 C) Oral 77  16  94 % -  04/22/12 1300 114/59 mmHg 99.5 F (37.5 C) - 76  18  91 % -   Pre op weight  88 kg Current Weight  04/23/12 190 lb 0.6 oz (86.2 kg)      Intake/Output from previous day: 05/13 0701 - 05/14 0700 In: 660 [P.O.:660] Out: 1500 [Urine:1500]   Physical Exam:  Cardiovascular: RRR, no murmurs, gallops, or rubs. Pulmonary: Mostly clear to auscultation bilaterally; no rales, wheezes, or rhonchi. Abdomen: Soft, non tender, bowel sounds present. Extremities: Trace bilateral lower extremity edema. Wounds: Clean and dry.  No erythema or signs of infection.  Lab Results: CBC:  Basename 04/21/12 0652  WBC 12.8*  HGB 11.2*  HCT 33.4*  PLT 140*   BMET:   Basename 04/21/12 0652  NA 136  K 4.5  CL 99  CO2 26  GLUCOSE 159*  BUN 16  CREATININE 1.00  CALCIUM 8.6    PT/INR: No results found for this basename: LABPROT,INR in the last 72 hours ABG:  INR: Will add last result for INR, ABG once components are confirmed Will add last 4 CBG results once components are confirmed  Assessment/Plan:  1. CV - Had a brief episode (7 beats) of NSVT last evening.Maintaining SR since then.Continue Lopressor 37.5 bid. 2.  Pulmonary - Encourage incentive spirometer. 3.  Acute blood loss anemia - Last H/H 11.2/33.4. 4.Mild thrombocytopenia-Platelets 140,000. 5.DM-CBGs 193/155/146.Pre op HGA1C 7.Continue with Metformin. 6.Remove EPW and CT sutures. 7.Discharge later today.    ZIMMERMAN,DONIELLE  MPA-C 04/23/2012    Chart reviewed, patient examined, agree with above. He is stable for discharge.

## 2012-04-23 NOTE — Progress Notes (Signed)
Removed pt's chest tube sutures and EPW. Applied steri strips and benzoin to site where chest tube sutures were removed. Pt tolerated well. No bleeding noted. Checked pt's heart rhythm overnight and no arrhythmias, INR WNL. Pt placed on frequent vitals for the next two hours and bedrest for next hour.

## 2012-04-23 NOTE — Progress Notes (Addendum)
Ed completed with pt. Requests his name be sent to [facility].  [phone] Ethelda Chick CES, ACSM

## 2012-05-13 ENCOUNTER — Other Ambulatory Visit: Payer: Self-pay | Admitting: Surgery

## 2012-05-13 DIAGNOSIS — I2510 Atherosclerotic heart disease of native coronary artery without angina pectoris: Secondary | ICD-10-CM

## 2012-05-14 ENCOUNTER — Encounter: Payer: Self-pay | Admitting: Surgery

## 2012-05-14 ENCOUNTER — Encounter: Payer: BC Managed Care – PPO | Admitting: Surgery

## 2012-05-15 ENCOUNTER — Ambulatory Visit (INDEPENDENT_AMBULATORY_CARE_PROVIDER_SITE_OTHER): Payer: Self-pay | Admitting: Surgery

## 2012-05-15 ENCOUNTER — Encounter: Payer: Self-pay | Admitting: Surgery

## 2012-05-15 ENCOUNTER — Ambulatory Visit
Admission: RE | Admit: 2012-05-15 | Discharge: 2012-05-15 | Disposition: A | Discharge status: Home / Self Care | Payer: BC Managed Care – PPO | Source: Ambulatory Visit | Attending: Surgery | Admitting: Surgery

## 2012-05-15 VITALS — BP 107/70 | HR 65 | Resp 16 | Ht 68.0 in | Wt 184.0 lb

## 2012-05-15 DIAGNOSIS — Z09 Encounter for follow-up examination after completed treatment for conditions other than malignant neoplasm: Secondary | ICD-10-CM

## 2012-05-15 DIAGNOSIS — I2510 Atherosclerotic heart disease of native coronary artery without angina pectoris: Secondary | ICD-10-CM

## 2012-05-16 ENCOUNTER — Encounter: Payer: Self-pay | Admitting: Surgery

## 2012-05-16 NOTE — Progress Notes (Signed)
                   [address]            [name] [address]          [phone]     HPI:  Patient returns for routine postoperative follow-up having undergone coronary bypass graft surgery x3 on 04/19/2012. The patient's early postoperative recovery while in the hospital was notable for an uncomplicated postoperative course. Since hospital discharge the patient reports he has been feeling well. He is walking several times per day without chest pain or shortness of breath..   Current Outpatient Prescriptions  Medication Sig Dispense Refill  . allopurinol (ZYLOPRIM) 100 MG tablet Take 100 mg by mouth daily.      Marland Kitchen atorvastatin (LIPITOR) 40 MG tablet Take 1 tablet (40 mg total) by mouth daily at 6 PM.  30 tablet  1  . metFORMIN (GLUCOPHAGE) 500 MG tablet Take 1,000 mg by mouth 2 (two) times daily with a meal.       . metoprolol tartrate (LOPRESSOR) 25 MG tablet Take 25 mg by mouth 2 (two) times daily.       No current facility-administered medications for this visit.   Facility-Administered Medications Ordered in Other Visits  Medication Dose Route Frequency Provider Last Rate Last Dose  . 0.9 %  sodium chloride infusion   Intravenous Continuous Donato Schultz, MD        Physical Exam: BP 107/70  Pulse 65  Resp 16  Ht 5\' 8"  (1.727 m)  Wt 184 lb (83.462 kg)  BMI 27.98 kg/m2  SpO2 97% He looks well. Cardiac exam shows regular rate and rhythm with normal heart sounds. Lung exam is clear. Chest incision is healing well and sternum is stable. His leg incision is healing well and there is no lower extremity edema.  Diagnostic Tests:   RADIOLOGY REPORT*   Clinical Data: Recent surgery for CABG, follow-up   CHEST - 2 VIEW    Comparison: Chest x-ray of 04/21/2012   Findings: Only a small left pleural effusion remains.  The right pleural effusion has resolved.  Aeration has improved with decrease in basilar atelectasis.  Mild cardiomegaly is stable.   Median sternotomy sutures are noted from CABG.   IMPRESSION: Improved aeration.  Small left effusion remains.   Original Report Authenticated By: Juline Patch, M.D.   Impression:  Overall he is doing quite well following his bypass surgery. I encouraged him to continue walking as much as possible. I told him he could return to driving a car but should refrain from lifting anything heavier than 10 pounds for a total of 3 months from the date of surgery. He is planning on starting cardiac rehabilitation soon.  Plan:  He will continue followup with cardiology and will contact me if he develops any problems with his incisions.

## 2012-05-23 ENCOUNTER — Encounter (HOSPITAL_COMMUNITY)
Admission: RE | Admit: 2012-05-23 | Discharge: 2012-05-23 | Disposition: A | Discharge status: Home / Self Care | Payer: BC Managed Care – PPO | Source: Ambulatory Visit | Attending: Cardiology | Admitting: Cardiology

## 2012-05-23 DIAGNOSIS — E7800 Pure hypercholesterolemia, unspecified: Secondary | ICD-10-CM | POA: Insufficient documentation

## 2012-05-23 DIAGNOSIS — I2510 Atherosclerotic heart disease of native coronary artery without angina pectoris: Secondary | ICD-10-CM | POA: Insufficient documentation

## 2012-05-23 DIAGNOSIS — E119 Type 2 diabetes mellitus without complications: Secondary | ICD-10-CM | POA: Insufficient documentation

## 2012-05-23 DIAGNOSIS — E785 Hyperlipidemia, unspecified: Secondary | ICD-10-CM | POA: Insufficient documentation

## 2012-05-23 DIAGNOSIS — Z5189 Encounter for other specified aftercare: Secondary | ICD-10-CM | POA: Insufficient documentation

## 2012-05-23 DIAGNOSIS — I10 Essential (primary) hypertension: Secondary | ICD-10-CM | POA: Insufficient documentation

## 2012-05-23 DIAGNOSIS — Z951 Presence of aortocoronary bypass graft: Secondary | ICD-10-CM | POA: Insufficient documentation

## 2012-05-23 DIAGNOSIS — I200 Unstable angina: Secondary | ICD-10-CM | POA: Insufficient documentation

## 2012-05-23 DIAGNOSIS — Z79899 Other long term (current) drug therapy: Secondary | ICD-10-CM | POA: Insufficient documentation

## 2012-05-23 NOTE — Progress Notes (Signed)
Cardiac Rehab Medication Review by a Pharmacist  Does the patient  feel that his/her medications are working for him/her?  yes  Has the patient been experiencing any side effects to the medications prescribed?  no  Does the patient measure his/her own blood pressure or blood glucose at home?  yes   Does the patient have any problems obtaining medications due to transportation or finances?   no  Understanding of regimen: excellent Understanding of indications: excellent Potential of compliance: excellent  Thank you,  Brett Fairy, PharmD Pager: [phone]  05/23/2012 8:25 AM

## 2012-05-27 ENCOUNTER — Encounter (HOSPITAL_COMMUNITY)
Admission: RE | Admit: 2012-05-27 | Discharge: 2012-05-27 | Disposition: A | Discharge status: Home / Self Care | Payer: BC Managed Care – PPO | Source: Ambulatory Visit | Attending: Cardiology | Admitting: Cardiology

## 2012-05-27 LAB — GLUCOSE, CAPILLARY: Glucose-Capillary: 114 mg/dL — ABNORMAL HIGH (ref 70–99)

## 2012-05-27 NOTE — Progress Notes (Signed)
Pt started cardiac rehab today.  Pt tolerated light exercise without difficulty. Monitor showed SR with no ectopy.

## 2012-05-29 ENCOUNTER — Encounter (HOSPITAL_COMMUNITY)
Admission: RE | Admit: 2012-05-29 | Discharge: 2012-05-29 | Disposition: A | Discharge status: Home / Self Care | Payer: BC Managed Care – PPO | Source: Ambulatory Visit | Attending: Cardiology | Admitting: Cardiology

## 2012-05-29 LAB — GLUCOSE, CAPILLARY
Glucose-Capillary: 172 mg/dL — ABNORMAL HIGH (ref 70–99)
Glucose-Capillary: 84 mg/dL (ref 70–99)
Glucose-Capillary: 86 mg/dL (ref 70–99)

## 2012-05-31 ENCOUNTER — Encounter (HOSPITAL_COMMUNITY)
Admission: RE | Admit: 2012-05-31 | Discharge: 2012-05-31 | Disposition: A | Discharge status: Home / Self Care | Payer: BC Managed Care – PPO | Source: Ambulatory Visit | Attending: Cardiology | Admitting: Cardiology

## 2012-06-03 ENCOUNTER — Encounter (HOSPITAL_COMMUNITY)
Admission: RE | Admit: 2012-06-03 | Discharge: 2012-06-03 | Disposition: A | Discharge status: Home / Self Care | Payer: BC Managed Care – PPO | Source: Ambulatory Visit | Attending: Cardiology | Admitting: Cardiology

## 2012-06-03 LAB — GLUCOSE, CAPILLARY: Glucose-Capillary: 135 mg/dL — ABNORMAL HIGH (ref 70–99)

## 2012-06-05 ENCOUNTER — Encounter (HOSPITAL_COMMUNITY)
Admission: RE | Admit: 2012-06-05 | Discharge: 2012-06-05 | Disposition: A | Discharge status: Home / Self Care | Payer: BC Managed Care – PPO | Source: Ambulatory Visit | Attending: Cardiology | Admitting: Cardiology

## 2012-06-05 LAB — GLUCOSE, CAPILLARY: Glucose-Capillary: 96 mg/dL (ref 70–99)

## 2012-06-07 ENCOUNTER — Encounter (HOSPITAL_COMMUNITY): Payer: Self-pay

## 2012-06-07 ENCOUNTER — Encounter (HOSPITAL_COMMUNITY)
Admission: RE | Admit: 2012-06-07 | Discharge: 2012-06-07 | Disposition: A | Discharge status: Home / Self Care | Payer: BC Managed Care – PPO | Source: Ambulatory Visit | Attending: Cardiology | Admitting: Cardiology

## 2012-06-07 LAB — GLUCOSE, CAPILLARY
Glucose-Capillary: 169 mg/dL — ABNORMAL HIGH (ref 70–99)
Glucose-Capillary: 98 mg/dL (ref 70–99)

## 2012-06-07 NOTE — Progress Notes (Signed)
Pt bradycardic at times during cool down, lowest rate 49, with 1.6sec pause (?blocked PAC). Pt c/o dizziness during this time.  BP-107/67, CBG -98 post exercise.  Spoke to [place], Dr. Anne Fu office who advised patient to come to office for evaluation.  Rhythm strips sent with pt.  Pt denies dizziness prior to leaving cardiac rehab after drinking lemonade.

## 2012-06-07 NOTE — Progress Notes (Signed)
Reviewed home exercise with pt today.  Pt plans to walk, do stretching, and use aerobic equipment for exercise.  Reviewed THR, pulse, RPE, sign and symptoms, and when to call 911 or MD.  Pt voiced understanding. Electronically signed by Harriett Sine MS on Friday June 07 2012 at [phone]

## 2012-06-10 NOTE — Progress Notes (Signed)
Cardiac Rehabilitation Program Outcomes Report Orientation:  05/23/2012 Discharge Date:  06/07/2012 # of sessions completed: 6/36  Cardiologist: Anne Fu Family MD:  Parke Poisson Time:  1115  A.  Exercise Program:  Tolerates exercise @ 3.5 METS for 30 minutes, Bike Test Results:  Pre: 1.07 miles and Discharged to home exercise program.  Anticipated compliance:  good  B.  Mental Health:  Good mental attitude, Quality of Life (QOL) Pre Scores:  Overall  28.04, Health/Functioning 27.23, Socioeconomics 28.21, Psych/Spiritual 28.21, Family 30   and PHQ-9: 0  C.  Education/Instruction/Skills  Accurately checks own pulse.  Rest:  79  Exercise:  126, Knows THR for exercise, Uses Perceived Exertion Scale and/or Dyspnea Scale and Attended 4/15 education classes  Home exercise given: 06/07/2012  D.  Nutrition/Weight Control/Body Composition:  Unable to evaluate pt nutrition due to nutrition survey returned incomplete. Section Completed by: Mickle Plumb, RD, LDN, CDE  E.  Blood Lipids  No results found for this basename: CHOL, HDL, LDLCALC, LDLDIRECT, TRIG, CHOLHDL   F.  Lifestyle Changes:  Making positive lifestyle changes  G.  Symptoms noted with exercise:  Asymptomatic  Report Completed By:  Fabio Pierce, MA, ACSM RCEP  Comments:  Pt only completed 6 sessions due to his insurance restarting in July and he would not be able to afford the deductible and out of pocket expenses beyond that.  Pt did well in program progressing to 3.5 METs.  Pt plans to continue to exercise by walking at home.  At d/c pt was in sinus bradycardia.  Thanks for the referral. Fabio Pierce, MA, ACSM RCEP       Pt had zero hospitalizations during 05/27/12 to 06/07/12, I agree with the above assessment.  Karlene Lineman RN

## 2012-12-11 HISTORY — PX: SKIN CANCER EXCISION: SHX779

## 2013-11-13 ENCOUNTER — Other Ambulatory Visit: Payer: Self-pay | Admitting: Urology

## 2013-11-18 ENCOUNTER — Encounter (HOSPITAL_COMMUNITY): Payer: Self-pay | Admitting: Pharmacy Technician

## 2013-11-27 ENCOUNTER — Encounter (HOSPITAL_COMMUNITY): Payer: Self-pay

## 2013-11-27 ENCOUNTER — Ambulatory Visit (HOSPITAL_COMMUNITY)
Admission: RE | Admit: 2013-11-27 | Discharge: 2013-11-27 | Disposition: A | Discharge status: Home / Self Care | Payer: Medicare PPO | Source: Ambulatory Visit | Attending: Urology | Admitting: Urology

## 2013-11-27 ENCOUNTER — Encounter (INDEPENDENT_AMBULATORY_CARE_PROVIDER_SITE_OTHER): Payer: Self-pay

## 2013-11-27 ENCOUNTER — Encounter (HOSPITAL_COMMUNITY)
Admission: RE | Admit: 2013-11-27 | Discharge: 2013-11-27 | Disposition: A | Discharge status: Home / Self Care | Payer: Medicare PPO | Source: Ambulatory Visit | Attending: Urology | Admitting: Urology

## 2013-11-27 DIAGNOSIS — Z01810 Encounter for preprocedural cardiovascular examination: Secondary | ICD-10-CM | POA: Insufficient documentation

## 2013-11-27 DIAGNOSIS — IMO0002 Reserved for concepts with insufficient information to code with codable children: Secondary | ICD-10-CM | POA: Insufficient documentation

## 2013-11-27 DIAGNOSIS — Z01812 Encounter for preprocedural laboratory examination: Secondary | ICD-10-CM | POA: Insufficient documentation

## 2013-11-27 DIAGNOSIS — N201 Calculus of ureter: Secondary | ICD-10-CM | POA: Insufficient documentation

## 2013-11-27 DIAGNOSIS — Z01818 Encounter for other preprocedural examination: Secondary | ICD-10-CM | POA: Insufficient documentation

## 2013-11-27 HISTORY — DX: Personal history of urinary calculi: Z87.442

## 2013-11-27 LAB — CBC
MCH: 31.7 pg (ref 26.0–34.0)
Platelets: 231 10*3/uL (ref 150–400)
RBC: 4.57 MIL/uL (ref 4.22–5.81)

## 2013-11-27 LAB — BASIC METABOLIC PANEL
BUN: 24 mg/dL — ABNORMAL HIGH (ref 6–23)
CO2: 26 mEq/L (ref 19–32)
Chloride: 104 mEq/L (ref 96–112)
Creatinine, Ser: 1.53 mg/dL — ABNORMAL HIGH (ref 0.50–1.35)
GFR calc Af Amer: 53 mL/min — ABNORMAL LOW (ref >90)
Potassium: 4.5 mEq/L (ref 3.5–5.1)

## 2013-11-27 NOTE — Progress Notes (Signed)
LOV note 07/15/12 Dr. Anne Fu on chart, ECHO 04/11/12 on chart, LOV note 06/07/12 Cristopher Peru NP on chart,Stress test 04/11/12 on chart

## 2013-11-27 NOTE — Patient Instructions (Signed)
[name]  11/27/2013   Your procedure is scheduled on: 12/01/13  Report to [facility] at 5:30 AM.  Call this number if you have problems the morning of surgery 336-: [phone]   Remember:   Do not eat food or drink liquids After Midnight.     Take these medicines the morning of surgery with A SIP OF WATER: oxycodone if needed   Do not wear jewelry, make-up or nail polish.  Do not wear lotions, powders, or perfumes. You may wear deodorant.  Do not shave 48 hours prior to surgery. Men may shave face and neck.  Do not bring valuables to the hospital.  Contacts, dentures or bridgework may not be worn into surgery.   Patients discharged the day of surgery will not be allowed to drive home.  Name and phone number of your driver: Dayl [phone]  Birdie Sons, RN  pre op nurse call if needed [phone]    FAILURE TO FOLLOW THESE INSTRUCTIONS MAY RESULT IN CANCELLATION OF YOUR SURGERY   Patient Signature: ___________________________________________

## 2013-11-30 ENCOUNTER — Encounter (HOSPITAL_COMMUNITY): Payer: Self-pay | Admitting: Anesthesiology

## 2013-11-30 NOTE — H&P (Signed)
  Urology History and Physical Exam  CC: Kidney stone  HPI: 67 year old male presents for ureteroscopic management of a 9 by 20 mm right distal ureteral stone. He presented in our office on 11/11/2013 with right flank pain. He was made comfortable and now presents for management.  PMH: Past Medical History  Diagnosis Date  . Coronary artery disease 04/2012  . Diabetes mellitus     type 2  . Gout   . History of kidney stones   . Pneumonia 1980's    hx of pna  . Skin cancer     PSH: Past Surgical History  Procedure Laterality Date  . Cardiac catheterization  04/16/2012   . Cardiovascular stress test  04/2012  . Knee arthroscopy  1990's  . Coronary artery bypass graft  04/19/2012    Procedure: CORONARY ARTERY BYPASS GRAFTING (CABG);  Surgeon: Alleen Borne, MD;  Location: [facility] OR;  Service: Open Heart Surgery;  Laterality: N/A;  . Kidney stone surgery  2007, 2008, 2010, 2012  . Skin cancer excision  2014    Allergies: No Known Allergies  Medications: No prescriptions prior to admission     Social History: History   Social History  . Marital Status: Married    Spouse Name: N/A    Number of Children: N/A  . Years of Education: N/A   Occupational History  . Not on file.   Social History Main Topics  . Smoking status: Never Smoker   . Smokeless tobacco: Never Used  . Alcohol Use: Yes     Comment: occasional  . Drug Use: No  . Sexual Activity: Yes   Other Topics Concern  . Not on file   Social History Narrative  . No narrative on file    Family History: No family history on file.  Review of Systems: Positive: Genitourinary and gastrointestinal system(s) were reviewed and pertinent findings if present are noted.  Gastrointestinal: nausea, vomiting and flank pain.     Negative:  A further 10 point review of systems was negative except what is listed in the HPI.  Physical Exam: @VITALS2 @ General: No acute distress.  Awake. Head:  Normocephalic.   Atraumatic. ENT:  EOMI.  Mucous membranes moist Neck:  Supple.  No lymphadenopathy. CV:  S1 present. S2 present. Regular rate. Pulmonary: Equal effort bilaterally.  Clear to auscultation bilaterally. Abdomen: Soft.  Non tender to palpation. Skin:  Normal turgor.  No visible rash. Extremity: No gross deformity of bilateral upper extremities.  No gross deformity of                             lower extremities. Neurologic: Alert. Appropriate mood.   Studies:  No results found for this basename: HGB, WBC, PLT,  in the last 72 hours  No results found for this basename: NA, K, CL, CO2, BUN, CREATININE, CALCIUM, MAGNESIUM, GFRNONAA, GFRAA,  in the last 72 hours   No results found for this basename: PT, INR, APTT,  in the last 72 hours   No components found with this basename: ABG,     Assessment:  9 by 20 mm right distal ureteral stone  Plan: Ureteroscopic management

## 2013-11-30 NOTE — Anesthesia Preprocedure Evaluation (Signed)
Anesthesia Evaluation  Patient identified by MRN, date of birth, ID band Patient awake    Reviewed: Allergy & Precautions, H&P , NPO status , Patient's Chart, lab work & pertinent test results  Airway Mallampati: II TM Distance: >3 FB Neck ROM: Full    Dental no notable dental hx.    Pulmonary pneumonia -, resolved,  breath sounds clear to auscultation  Pulmonary exam normal       Cardiovascular Exercise Tolerance: Good + angina + CAD negative cardio ROS  Rhythm:Regular Rate:Normal  S/P CABG May 2013  ECG and CXR reviewed.   Neuro/Psych negative neurological ROS  negative psych ROS   GI/Hepatic negative GI ROS, Neg liver ROS,   Endo/Other  diabetes, Type 2, Oral Hypoglycemic Agents  Renal/GU negative Renal ROS  negative genitourinary   Musculoskeletal negative musculoskeletal ROS (+)   Abdominal   Peds negative pediatric ROS (+)  Hematology negative hematology ROS (+)   Anesthesia Other Findings   Reproductive/Obstetrics negative OB ROS                           Anesthesia Physical Anesthesia Plan  ASA: III  Anesthesia Plan: General   Post-op Pain Management:    Induction: Intravenous  Airway Management Planned: LMA  Additional Equipment:   Intra-op Plan:   Post-operative Plan: Extubation in OR  Informed Consent: I have reviewed the patients History and Physical, chart, labs and discussed the procedure including the risks, benefits and alternatives for the proposed anesthesia with the patient or authorized representative who has indicated his/her understanding and acceptance.   Dental advisory given  Plan Discussed with: CRNA  Anesthesia Plan Comments:         Anesthesia Quick Evaluation

## 2013-12-01 ENCOUNTER — Encounter (HOSPITAL_COMMUNITY)
Admission: RE | Disposition: A | Discharge status: Home / Self Care | Payer: Self-pay | Source: Ambulatory Visit | Attending: Urology

## 2013-12-01 ENCOUNTER — Encounter (HOSPITAL_COMMUNITY): Payer: Self-pay | Admitting: *Deleted

## 2013-12-01 ENCOUNTER — Ambulatory Visit (HOSPITAL_COMMUNITY): Payer: Medicare PPO

## 2013-12-01 ENCOUNTER — Encounter (HOSPITAL_COMMUNITY): Payer: Medicare PPO | Admitting: Anesthesiology

## 2013-12-01 ENCOUNTER — Ambulatory Visit (HOSPITAL_COMMUNITY)
Admission: RE | Admit: 2013-12-01 | Discharge: 2013-12-01 | Disposition: A | Discharge status: Home / Self Care | Payer: Medicare PPO | Source: Ambulatory Visit | Attending: Urology | Admitting: Urology

## 2013-12-01 ENCOUNTER — Ambulatory Visit (HOSPITAL_COMMUNITY): Payer: Medicare PPO | Admitting: Anesthesiology

## 2013-12-01 DIAGNOSIS — I2510 Atherosclerotic heart disease of native coronary artery without angina pectoris: Secondary | ICD-10-CM | POA: Insufficient documentation

## 2013-12-01 DIAGNOSIS — E119 Type 2 diabetes mellitus without complications: Secondary | ICD-10-CM | POA: Insufficient documentation

## 2013-12-01 DIAGNOSIS — M109 Gout, unspecified: Secondary | ICD-10-CM | POA: Insufficient documentation

## 2013-12-01 DIAGNOSIS — N201 Calculus of ureter: Secondary | ICD-10-CM | POA: Insufficient documentation

## 2013-12-01 DIAGNOSIS — Z951 Presence of aortocoronary bypass graft: Secondary | ICD-10-CM | POA: Insufficient documentation

## 2013-12-01 HISTORY — PX: CYSTOSCOPY WITH URETEROSCOPY, STONE BASKETRY AND STENT PLACEMENT: SHX6378

## 2013-12-01 HISTORY — PX: HOLMIUM LASER APPLICATION: SHX5852

## 2013-12-01 LAB — GLUCOSE, CAPILLARY: Glucose-Capillary: 120 mg/dL — ABNORMAL HIGH (ref 70–99)

## 2013-12-01 SURGERY — CYSTOSCOPY, WITH CALCULUS MANIPULATION OR REMOVAL
Anesthesia: General | Site: Ureter | Laterality: Right

## 2013-12-01 MED ORDER — LIDOCAINE HCL (PF) 2 % IJ SOLN
INTRAMUSCULAR | Status: DC | PRN
  Administered 2013-12-01: 100 mg via INTRADERMAL

## 2013-12-01 MED ORDER — PROMETHAZINE HCL 25 MG/ML IJ SOLN
INTRAMUSCULAR | Status: AC
  Filled 2013-12-01: qty 1

## 2013-12-01 MED ORDER — ATROPINE SULFATE 0.4 MG/ML IJ SOLN
INTRAMUSCULAR | Status: AC
  Filled 2013-12-01: qty 1

## 2013-12-01 MED ORDER — ONDANSETRON HCL 4 MG/2ML IJ SOLN
INTRAMUSCULAR | Status: AC
  Filled 2013-12-01: qty 2

## 2013-12-01 MED ORDER — EPHEDRINE SULFATE 50 MG/ML IJ SOLN
INTRAMUSCULAR | Status: AC
  Filled 2013-12-01: qty 1

## 2013-12-01 MED ORDER — LACTATED RINGERS IV SOLN
INTRAVENOUS | Status: DC | PRN
  Administered 2013-12-01: 07:00:00 via INTRAVENOUS

## 2013-12-01 MED ORDER — MIDAZOLAM HCL 5 MG/5ML IJ SOLN
INTRAMUSCULAR | Status: DC | PRN
  Administered 2013-12-01: 2 mg via INTRAVENOUS

## 2013-12-01 MED ORDER — IOHEXOL 300 MG/ML  SOLN
INTRAMUSCULAR | Status: DC | PRN
  Administered 2013-12-01: 15 mL via URETHRAL

## 2013-12-01 MED ORDER — LIDOCAINE HCL (CARDIAC) 20 MG/ML IV SOLN
INTRAVENOUS | Status: AC
  Filled 2013-12-01: qty 5

## 2013-12-01 MED ORDER — EPHEDRINE SULFATE 50 MG/ML IJ SOLN
INTRAMUSCULAR | Status: DC | PRN
  Administered 2013-12-01: 5 mg via INTRAVENOUS

## 2013-12-01 MED ORDER — FENTANYL CITRATE 0.05 MG/ML IJ SOLN: 25.0000 ug | INTRAMUSCULAR | Status: DC | PRN

## 2013-12-01 MED ORDER — MIDAZOLAM HCL 2 MG/2ML IJ SOLN
INTRAMUSCULAR | Status: AC
  Filled 2013-12-01: qty 2

## 2013-12-01 MED ORDER — CIPROFLOXACIN HCL 250 MG PO TABS: 250.0000 mg | ORAL_TABLET | Freq: Two times a day (BID) | ORAL | Status: DC

## 2013-12-01 MED ORDER — OXYBUTYNIN CHLORIDE 5 MG PO TABS: 5.0000 mg | ORAL_TABLET | Freq: Three times a day (TID) | ORAL | Status: DC

## 2013-12-01 MED ORDER — STERILE WATER FOR INJECTION IJ SOLN
INTRAMUSCULAR | Status: AC
  Filled 2013-12-01: qty 10

## 2013-12-01 MED ORDER — LIDOCAINE HCL 2 % EX GEL
CUTANEOUS | Status: AC
  Filled 2013-12-01: qty 10

## 2013-12-01 MED ORDER — CEFAZOLIN SODIUM-DEXTROSE 2-3 GM-% IV SOLR
INTRAVENOUS | Status: AC
  Filled 2013-12-01: qty 50

## 2013-12-01 MED ORDER — PROPOFOL 10 MG/ML IV BOLUS
INTRAVENOUS | Status: AC
  Filled 2013-12-01: qty 20

## 2013-12-01 MED ORDER — SODIUM CHLORIDE 0.9 % IR SOLN
Status: DC | PRN
  Administered 2013-12-01: 7000 mL

## 2013-12-01 MED ORDER — FENTANYL CITRATE 0.05 MG/ML IJ SOLN
INTRAMUSCULAR | Status: AC
  Filled 2013-12-01: qty 2

## 2013-12-01 MED ORDER — ONDANSETRON HCL 4 MG/2ML IJ SOLN
INTRAMUSCULAR | Status: DC | PRN
  Administered 2013-12-01: 4 mg via INTRAVENOUS

## 2013-12-01 MED ORDER — FENTANYL CITRATE 0.05 MG/ML IJ SOLN
INTRAMUSCULAR | Status: DC | PRN
  Administered 2013-12-01 (×6): 50 ug via INTRAVENOUS

## 2013-12-01 MED ORDER — LACTATED RINGERS IV SOLN
INTRAVENOUS | Status: DC
  Administered 2013-12-01: 10:00:00 via INTRAVENOUS

## 2013-12-01 MED ORDER — FENTANYL CITRATE 0.05 MG/ML IJ SOLN
INTRAMUSCULAR | Status: AC
  Filled 2013-12-01: qty 5

## 2013-12-01 MED ORDER — PROMETHAZINE HCL 25 MG/ML IJ SOLN
6.2500 mg | INTRAMUSCULAR | Status: DC | PRN
  Administered 2013-12-01: 6.25 mg via INTRAVENOUS

## 2013-12-01 MED ORDER — SODIUM CHLORIDE 0.9 % IJ SOLN
INTRAMUSCULAR | Status: AC
  Filled 2013-12-01: qty 10

## 2013-12-01 MED ORDER — CEFAZOLIN SODIUM-DEXTROSE 2-3 GM-% IV SOLR
2.0000 g | INTRAVENOUS | Status: AC
  Administered 2013-12-01: 2 g via INTRAVENOUS

## 2013-12-01 MED ORDER — PROPOFOL 10 MG/ML IV BOLUS
INTRAVENOUS | Status: DC | PRN
  Administered 2013-12-01: 200 mg via INTRAVENOUS

## 2013-12-01 SURGICAL SUPPLY — 16 items
BAG URO CATCHER STRL LF (DRAPE) ×2 IMPLANT
BASKET ZERO TIP NITINOL 2.4FR (BASKET) ×2 IMPLANT
BSKT STON RTRVL ZERO TP 2.4FR (BASKET) ×1
DRAPE CAMERA CLOSED 9X96 (DRAPES) ×2 IMPLANT
FIBER LASER FLEXIVA 365 (UROLOGICAL SUPPLIES) ×2 IMPLANT
GLOVE SURG SS PI 8.0 STRL IVOR (GLOVE) ×2 IMPLANT
GOWN PREVENTION PLUS XLARGE (GOWN DISPOSABLE) ×2 IMPLANT
GOWN STRL REIN XL XLG (GOWN DISPOSABLE) ×2 IMPLANT
GUIDEWIRE STR DUAL SENSOR (WIRE) ×2 IMPLANT
MANIFOLD NEPTUNE II (INSTRUMENTS) ×2 IMPLANT
MARKER SKIN DUAL TIP RULER LAB (MISCELLANEOUS) ×2 IMPLANT
PACK CYSTO (CUSTOM PROCEDURE TRAY) ×2 IMPLANT
SHEATH URET ACCESS 12FR/35CM (UROLOGICAL SUPPLIES) ×2 IMPLANT
STENT CONTOUR 6FRX24X.038 (STENTS) ×2 IMPLANT
SYRINGE IRR TOOMEY STRL 70CC (SYRINGE) ×2 IMPLANT
TUBING CONNECTING 10 (TUBING) ×2 IMPLANT

## 2013-12-01 NOTE — Anesthesia Postprocedure Evaluation (Signed)
  Anesthesia Post-op Note  Patient: [name]  Procedure(s) Performed: Procedure(s) (LRB): CYSTOSCOPY WITH URETEROSCOPY, RIGHT RETROGRADE PYLEGRAM ,  STONE REMOVAL,  AND STENT PLACEMENT (Right) HOLMIUM LASER APPLICATION (Right)  Patient Location: PACU  Anesthesia Type: General  Level of Consciousness: awake and alert   Airway and Oxygen Therapy: Patient Spontanous Breathing  Post-op Pain: mild  Post-op Assessment: Post-op Vital signs reviewed, Patient's Cardiovascular Status Stable, Respiratory Function Stable, Patent Airway and No signs of Nausea or vomiting  Last Vitals:  Filed Vitals:   12/01/13 1110  BP: 131/74  Pulse: 61  Temp:   Resp: 16    Post-op Vital Signs: stable   Complications: No apparent anesthesia complications

## 2013-12-01 NOTE — Interval H&P Note (Signed)
History and Physical Interval Note:  12/01/2013 7:34 AM  [name]  has presented today for surgery, with the diagnosis of Right Distal Ureteral Stone  The various methods of treatment have been discussed with the patient and family. After consideration of risks, benefits and other options for treatment, the patient has consented to  Procedure(s): CYSTOSCOPY WITH URETEROSCOPY, STONE BASKETRY AND STENT PLACEMENT (Right) HOLMIUM LASER APPLICATION (Right) as a surgical intervention .  The patient's history has been reviewed, patient examined, no change in status, stable for surgery.  I have reviewed the patient's chart and labs.  Questions were answered to the patient's satisfaction.     [name]

## 2013-12-01 NOTE — Op Note (Signed)
PATIENT:  [name]  PRE-OPERATIVE DIAGNOSIS: Large right ureteral stone  POST-OPERATIVE DIAGNOSIS: Same  PROCEDURE: Cystoscopy, right retrograde ureteropyelogram with interpretation of fluoroscopy, right ureteroscopy, holmium laser and extraction of ureteral calculus, double-J stent placement  SURGEON:  Bertram Millard. Xia Stohr, M.D.  ANESTHESIA:  General  EBL:  Minimal  DRAINS: 24 cm x 6 French double-J stent with string  LOCAL MEDICATIONS USED:  None  SPECIMEN: Stone fragments, to family   INDICATION: [name] is a 67 year old male who presents for cystoscopic/ureteroscopic management of very large right ureteral stone. He is aware of the procedure of ureteroscopy and holmium laser of the stone, extraction, and possible double-J stent placement. He understands the risks and complications and desires to proceed.  Description of procedure: The patient was properly identified and marked (if applicable) in the holding area. They were then  taken to the operating room and placed on the table in a supine position. General anesthesia was then administered. Once fully anesthetized the patient was moved to the dorsolithotomy position and the genitalia and perineum were sterilely prepped and draped in standard fashion. An official timeout was then performed.   I then passed a 22 French panendoscope through his urethra. The urethra was nonobstructive, without evidence of stricture. Prostate was nonobstructive. Bladder was entered and inspected circumferentially. No tumors, trabeculations or foreign bodies were noted. Ureteral orifices were normal in configuration and location.  The right ureteral orifice was cannulated with a 6 [place] open-ended catheter. Retrograde pyelogram was performed.  This revealed a large filling defect in the distal right ureter. There is proximal colonization/hydroureteronephrosis. Calyceal system was blown out. No filling defects were seen proximal to this large  right distal ureteral stone.  I then passed a guidewire to the open-ended guidewire, into the right renal pelvis were curl was seen. The cystoscope and open-ended catheter were removed. I then dilated the distal ureter/ureteral orifice to 12 [place] with the ureteral access sheath. I then passed a 6 [place] short ureteroscope under direct vision through the urethra, up into the right ureter where the stone was encountered. A 365  fiber was then passed through this. At very low energy level, and high frequency, the stone was fragmented into multiple sand-like fragments. The larger fragments were taken down to a size which I would estimate less than 2-3 mm. All the stone was fragmented in such a way. I then removed the laser fiber, and passed a Nitinol basket, removing these larger fragments quite easily. The smaller ones had already rinsed into the bladder. The fragments were all taken down into the bladder. Following what I thought was total evacuation of the stone burden, the scope was passed proximally. No further significant fragments were seen. There was a significant amount of inflammation at the spot of the previous stone. A thought because of the significant instrumentation a should leave a double-J stent. Over top of the guidewire, using the cystoscope, I then passed a 24 cm x 6 French contour stent. Good proximal and distal curls were seen once the guidewire was removed. I then brought the string through the penis area I then passed the scope again, and irrigated a fair number of fragments from the bladder. These were then recovered and will be eventually given to the family.  At this point, the scope was removed once again the procedure terminated. The patient was awakened in stable condition. He tolerated the procedure well.   PLAN OF CARE: Discharge to home after PACU  PATIENT DISPOSITION:  PACU -  hemodynamically stable.

## 2013-12-01 NOTE — Transfer of Care (Signed)
Immediate Anesthesia Transfer of Care Note  Patient: [name]  Procedure(s) Performed: Procedure(s): CYSTOSCOPY WITH URETEROSCOPY, RIGHT RETROGRADE PYLEGRAM ,  STONE REMOVAL,  AND STENT PLACEMENT (Right) HOLMIUM LASER APPLICATION (Right)  Patient Location: PACU  Anesthesia Type:General  Level of Consciousness: awake, sedated, patient cooperative and responds to stimulation  Airway & Oxygen Therapy: Patient Spontanous Breathing and Patient connected to face mask oxygen  Post-op Assessment: Report given to PACU RN and Post -op Vital signs reviewed and stable  Post vital signs: Reviewed and stable  Complications: No apparent anesthesia complications

## 2013-12-02 ENCOUNTER — Encounter (HOSPITAL_COMMUNITY): Payer: Self-pay | Admitting: Urology

## 2014-01-20 ENCOUNTER — Telehealth: Payer: Self-pay | Admitting: Cardiology

## 2014-01-20 NOTE — Telephone Encounter (Signed)
New Message  Pt called states that he has had open heart surgery recently, On the  left side of his chest he has sharp pains that seems to be getting worse// he states he has called PCP, Dr, Donovan KailAllen [name] has appt on 02/11 at 2:15//  Pt is requesting a call back from the nurse to discuss further

## 2014-01-26 NOTE — Telephone Encounter (Signed)
Spoke with patient ,patient consulted with PCP, unrelated to heart

## 2014-06-02 ENCOUNTER — Other Ambulatory Visit: Payer: Self-pay | Admitting: *Deleted

## 2014-06-02 ENCOUNTER — Encounter: Payer: Self-pay | Admitting: *Deleted

## 2014-10-19 ENCOUNTER — Other Ambulatory Visit: Payer: Self-pay | Admitting: Urology

## 2014-10-26 ENCOUNTER — Encounter (HOSPITAL_BASED_OUTPATIENT_CLINIC_OR_DEPARTMENT_OTHER): Payer: Self-pay | Admitting: *Deleted

## 2014-10-27 ENCOUNTER — Encounter (HOSPITAL_BASED_OUTPATIENT_CLINIC_OR_DEPARTMENT_OTHER): Payer: Self-pay | Admitting: *Deleted

## 2014-10-29 ENCOUNTER — Encounter (HOSPITAL_BASED_OUTPATIENT_CLINIC_OR_DEPARTMENT_OTHER): Payer: Self-pay | Admitting: *Deleted

## 2014-10-29 NOTE — Progress Notes (Signed)
NPO AFTER MN. ARRIVE AT 0700. NEEDS ISTAT. CURRENT EKG IN CHART AND EPIC. MAY TAKE OXYCODONE IF NEEDED AM DOS W/ SIPS OF WATER.

## 2014-11-02 ENCOUNTER — Ambulatory Visit (HOSPITAL_BASED_OUTPATIENT_CLINIC_OR_DEPARTMENT_OTHER): Payer: Medicare PPO | Admitting: Anesthesiology

## 2014-11-02 ENCOUNTER — Encounter (HOSPITAL_BASED_OUTPATIENT_CLINIC_OR_DEPARTMENT_OTHER)
Admission: RE | Disposition: A | Discharge status: Home / Self Care | Payer: Self-pay | Source: Ambulatory Visit | Attending: Urology

## 2014-11-02 ENCOUNTER — Ambulatory Visit (HOSPITAL_BASED_OUTPATIENT_CLINIC_OR_DEPARTMENT_OTHER)
Admission: RE | Admit: 2014-11-02 | Discharge: 2014-11-02 | Disposition: A | Discharge status: Home / Self Care | Payer: Medicare PPO | Source: Ambulatory Visit | Attending: Urology | Admitting: Urology

## 2014-11-02 ENCOUNTER — Encounter (HOSPITAL_BASED_OUTPATIENT_CLINIC_OR_DEPARTMENT_OTHER): Payer: Self-pay | Admitting: *Deleted

## 2014-11-02 DIAGNOSIS — I2510 Atherosclerotic heart disease of native coronary artery without angina pectoris: Secondary | ICD-10-CM | POA: Diagnosis not present

## 2014-11-02 DIAGNOSIS — N201 Calculus of ureter: Secondary | ICD-10-CM | POA: Diagnosis not present

## 2014-11-02 DIAGNOSIS — Z951 Presence of aortocoronary bypass graft: Secondary | ICD-10-CM | POA: Insufficient documentation

## 2014-11-02 DIAGNOSIS — M109 Gout, unspecified: Secondary | ICD-10-CM | POA: Insufficient documentation

## 2014-11-02 DIAGNOSIS — N200 Calculus of kidney: Secondary | ICD-10-CM | POA: Insufficient documentation

## 2014-11-02 DIAGNOSIS — E119 Type 2 diabetes mellitus without complications: Secondary | ICD-10-CM | POA: Diagnosis not present

## 2014-11-02 HISTORY — PX: HOLMIUM LASER APPLICATION: SHX5852

## 2014-11-02 HISTORY — DX: Presence of aortocoronary bypass graft: Z95.1

## 2014-11-02 HISTORY — DX: Presence of dental prosthetic device (complete) (partial): Z97.2

## 2014-11-02 HISTORY — DX: Type 2 diabetes mellitus without complications: E11.9

## 2014-11-02 HISTORY — PX: CYSTOSCOPY WITH RETROGRADE PYELOGRAM, URETEROSCOPY AND STENT PLACEMENT: SHX5789

## 2014-11-02 HISTORY — DX: Urgency of urination: R39.15

## 2014-11-02 HISTORY — DX: Calculus of ureter: N20.1

## 2014-11-02 HISTORY — DX: Personal history of other malignant neoplasm of skin: Z85.828

## 2014-11-02 LAB — POCT I-STAT 4, (NA,K, GLUC, HGB,HCT)
GLUCOSE: 118 mg/dL — AB (ref 70–99)
HEMATOCRIT: 45 % (ref 39.0–52.0)
Hemoglobin: 15.3 g/dL (ref 13.0–17.0)
POTASSIUM: 4 meq/L (ref 3.7–5.3)
Sodium: 143 mEq/L (ref 137–147)

## 2014-11-02 LAB — GLUCOSE, CAPILLARY: Glucose-Capillary: 130 mg/dL — ABNORMAL HIGH (ref 70–99)

## 2014-11-02 SURGERY — CYSTOURETEROSCOPY, WITH RETROGRADE PYELOGRAM AND STENT INSERTION
Anesthesia: General | Site: Ureter | Laterality: Left

## 2014-11-02 MED ORDER — MIDAZOLAM HCL 2 MG/2ML IJ SOLN
INTRAMUSCULAR | Status: AC
  Filled 2014-11-02: qty 2

## 2014-11-02 MED ORDER — DEXAMETHASONE SODIUM PHOSPHATE 4 MG/ML IJ SOLN
INTRAMUSCULAR | Status: DC | PRN
  Administered 2014-11-02: 10 mg via INTRAVENOUS

## 2014-11-02 MED ORDER — CEFAZOLIN SODIUM-DEXTROSE 2-3 GM-% IV SOLR
INTRAVENOUS | Status: AC
  Filled 2014-11-02: qty 50

## 2014-11-02 MED ORDER — SODIUM CHLORIDE 0.9 % IR SOLN
Status: DC | PRN
  Administered 2014-11-02: 6000 mL

## 2014-11-02 MED ORDER — METOCLOPRAMIDE HCL 5 MG/ML IJ SOLN
INTRAMUSCULAR | Status: DC | PRN
  Administered 2014-11-02: 10 mg via INTRAVENOUS

## 2014-11-02 MED ORDER — CEFAZOLIN SODIUM-DEXTROSE 2-3 GM-% IV SOLR
2.0000 g | INTRAVENOUS | Status: AC
  Administered 2014-11-02: 2 g via INTRAVENOUS
  Filled 2014-11-02: qty 50

## 2014-11-02 MED ORDER — EPHEDRINE SULFATE 50 MG/ML IJ SOLN
INTRAMUSCULAR | Status: DC | PRN
  Administered 2014-11-02: 10 mg via INTRAVENOUS

## 2014-11-02 MED ORDER — FENTANYL CITRATE 0.05 MG/ML IJ SOLN
INTRAMUSCULAR | Status: DC | PRN
  Administered 2014-11-02: 50 ug via INTRAVENOUS

## 2014-11-02 MED ORDER — PROMETHAZINE HCL 25 MG/ML IJ SOLN
6.2500 mg | INTRAMUSCULAR | Status: DC | PRN
  Filled 2014-11-02: qty 1

## 2014-11-02 MED ORDER — LACTATED RINGERS IV SOLN
INTRAVENOUS | Status: DC
  Administered 2014-11-02: 08:00:00 via INTRAVENOUS
  Filled 2014-11-02: qty 1000

## 2014-11-02 MED ORDER — ONDANSETRON HCL 4 MG/2ML IJ SOLN
INTRAMUSCULAR | Status: DC | PRN
  Administered 2014-11-02: 4 mg via INTRAVENOUS

## 2014-11-02 MED ORDER — HYDROMORPHONE HCL 1 MG/ML IJ SOLN
0.2500 mg | INTRAMUSCULAR | Status: DC | PRN
  Filled 2014-11-02: qty 1

## 2014-11-02 MED ORDER — IOHEXOL 350 MG/ML SOLN
INTRAVENOUS | Status: DC | PRN
  Administered 2014-11-02: 10 mL via URETHRAL

## 2014-11-02 MED ORDER — LIDOCAINE HCL (CARDIAC) 20 MG/ML IV SOLN
INTRAVENOUS | Status: DC | PRN
  Administered 2014-11-02: 80 mg via INTRAVENOUS

## 2014-11-02 MED ORDER — 0.9 % SODIUM CHLORIDE (POUR BTL) OPTIME
TOPICAL | Status: DC | PRN
  Administered 2014-11-02: 1000 mL

## 2014-11-02 MED ORDER — MEPERIDINE HCL 25 MG/ML IJ SOLN
6.2500 mg | INTRAMUSCULAR | Status: DC | PRN
  Filled 2014-11-02: qty 1

## 2014-11-02 MED ORDER — CEFAZOLIN SODIUM 1-5 GM-% IV SOLN
1.0000 g | INTRAVENOUS | Status: DC
  Filled 2014-11-02: qty 50

## 2014-11-02 MED ORDER — OXYCODONE HCL 5 MG PO TABS
ORAL_TABLET | ORAL | Status: AC
  Filled 2014-11-02: qty 1

## 2014-11-02 MED ORDER — FENTANYL CITRATE 0.05 MG/ML IJ SOLN
25.0000 ug | INTRAMUSCULAR | Status: DC | PRN
  Filled 2014-11-02: qty 0.5

## 2014-11-02 MED ORDER — FENTANYL CITRATE 0.05 MG/ML IJ SOLN
INTRAMUSCULAR | Status: AC
  Filled 2014-11-02: qty 4

## 2014-11-02 MED ORDER — CEPHALEXIN 500 MG PO CAPS: 500.0000 mg | ORAL_CAPSULE | Freq: Four times a day (QID) | ORAL | Status: DC

## 2014-11-02 MED ORDER — MIDAZOLAM HCL 5 MG/5ML IJ SOLN
INTRAMUSCULAR | Status: DC | PRN
  Administered 2014-11-02: 2 mg via INTRAVENOUS

## 2014-11-02 MED ORDER — ACETAMINOPHEN 10 MG/ML IV SOLN
INTRAVENOUS | Status: DC | PRN
  Administered 2014-11-02: 1000 mg via INTRAVENOUS

## 2014-11-02 MED ORDER — PROPOFOL 10 MG/ML IV BOLUS
INTRAVENOUS | Status: DC | PRN
  Administered 2014-11-02: 200 mg via INTRAVENOUS

## 2014-11-02 MED ORDER — OXYCODONE HCL 5 MG/5ML PO SOLN
5.0000 mg | Freq: Once | ORAL | Status: AC | PRN
  Filled 2014-11-02: qty 5

## 2014-11-02 MED ORDER — OXYCODONE HCL 5 MG PO TABS
5.0000 mg | ORAL_TABLET | Freq: Once | ORAL | Status: AC | PRN
  Administered 2014-11-02: 5 mg via ORAL
  Filled 2014-11-02: qty 1

## 2014-11-02 SURGICAL SUPPLY — 28 items
ADAPTER CATH URET PLST 4-6FR (CATHETERS) IMPLANT
ADPR CATH URET STRL DISP 4-6FR (CATHETERS)
BAG DRAIN URO-CYSTO SKYTR STRL (DRAIN) ×2 IMPLANT
BAG DRN UROCATH (DRAIN) ×1
BASKET ZERO TIP NITINOL 2.4FR (BASKET) ×2 IMPLANT
BSKT STON RTRVL ZERO TP 2.4FR (BASKET) ×1
CANISTER SUCT LVC 12 LTR MEDI- (MISCELLANEOUS) ×2 IMPLANT
CATH INTERMIT  6FR 70CM (CATHETERS) IMPLANT
CLOTH BEACON ORANGE TIMEOUT ST (SAFETY) ×2 IMPLANT
DRAPE CAMERA CLOSED 9X96 (DRAPES) ×2 IMPLANT
FIBER LASER FLEXIVA 200 (UROLOGICAL SUPPLIES) ×2 IMPLANT
FIBER LASER TRAC TIP (UROLOGICAL SUPPLIES) IMPLANT
GLOVE BIO SURGEON STRL SZ8 (GLOVE) ×2 IMPLANT
GLOVE SS N UNI LF 7.0 STRL (GLOVE) ×4 IMPLANT
GOWN PREVENTION PLUS LG XLONG (DISPOSABLE) IMPLANT
GOWN STRL REIN XL XLG (GOWN DISPOSABLE) ×2 IMPLANT
GOWN STRL REUS W/TWL XL LVL3 (GOWN DISPOSABLE) ×4 IMPLANT
GUIDEWIRE 0.038 PTFE COATED (WIRE) IMPLANT
GUIDEWIRE ANG ZIPWIRE 038X150 (WIRE) IMPLANT
GUIDEWIRE STR DUAL SENSOR (WIRE) IMPLANT
IV NS IRRIG 3000ML ARTHROMATIC (IV SOLUTION) ×4 IMPLANT
LASER FIBER DISP (UROLOGICAL SUPPLIES) IMPLANT
NS IRRIG 500ML POUR BTL (IV SOLUTION) ×2 IMPLANT
PACK CYSTO (CUSTOM PROCEDURE TRAY) ×2 IMPLANT
SHEATH ACCESS URETERAL 38CM (SHEATH) ×2 IMPLANT
STENT URET 6FRX24 CONTOUR (STENTS) ×2 IMPLANT
SYR CONTROL 10ML LL (SYRINGE) ×2 IMPLANT
SYRINGE IRR TOOMEY STRL 70CC (SYRINGE) ×2 IMPLANT

## 2014-11-02 NOTE — Discharge Instructions (Signed)
[facility] [phone] Post Ureteroscopy With or Without Stent Instructions  Definitions:  Ureter: The duct that transports urine from the kidney to the bladder. Stent:   A plastic hollow tube that is placed into the ureter, from the kidney to the bladder to prevent the ureter from swelling shut.  GENERAL INSTRUCTIONS:  Despite the fact that no skin incisions were used, the area around the ureter and bladder is raw and irritated. The stent is a foreign body which will further irritate the bladder wall. This irritation is manifested by increased frequency of urination, both day and night, and by an increase in the urge to urinate. In some, the urge to urinate is present almost always. Sometimes the urge is strong enough that you may not be able to stop yourself from urinating. The only real cure is to remove the stent and then give time for the bladder wall to heal which can't be done until the danger of the ureter swelling shut has passed, which varies.  You may see some blood in your urine while the stent is in place and a few days afterwards. Do not be alarmed, even if the urine was clear for a while. Get off your feet and drink lots of fluids until clearing occurs. If you start to pass clots or don't improve, call us.  Pull string to remove stent on Wed morning  DIET: You may return to your normal diet immediately. Because of the raw surface of your bladder, alcohol, spicy foods, acid type foods and drinks with caffeine may cause irritation or frequency and should be used in moderation. To keep your urine flowing freely and to avoid constipation, drink plenty of fluids during the day ( 8-10 glasses ). Tip: Avoid cranberry juice because it is very acidic.  ACTIVITY: Your physical activity doesn't need to be restricted. However, if you are very active, you may see some blood in your urine. We suggest that you reduce your activity under these circumstances until the bleeding  has stopped.  BOWELS: It is important to keep your bowels regular during the postoperative period. Straining with bowel movements can cause bleeding. A bowel movement every other day is reasonable. Use a mild laxative if needed, such as Milk of Magnesia 2-3 tablespoons, or 2 Dulcolax tablets. Call if you continue to have problems. If you have been taking narcotics for pain, before, during or after your surgery, you may be constipated. Take a laxative if necessary.   MEDICATION: You should resume your pre-surgery medications unless told not to. In addition you will often be given an antibiotic to prevent infection. These should be taken as prescribed until the bottles are finished unless you are having an unusual reaction to one of the drugs.  PROBLEMS YOU SHOULD REPORT TO US:  Fevers over 100.5 Fahrenheit.  Heavy bleeding, or clots ( See above notes about blood in urine ).  Inability to urinate.  Drug reactions ( hives, rash, nausea, vomiting, diarrhea ).  Severe burning or pain with urination that is not improving.  FOLLOW-UP: You will need a follow-up appointment to monitor your progress. Call for this appointment at the number listed above. Usually the first appointment will be about three to fourteen days after your surgery.      Post Anesthesia Home Care Instructions  Activity: Get plenty of rest for the remainder of the day. A responsible adult should stay with you for 24 hours following the procedure.  For the next 24 hours, DO NOT: -  Drive a car -Advertising copywriterperate machinery -Drink alcoholic beverages -Take any medication unless instructed by your physician -Make any legal decisions or sign important papers.  Meals: Start with liquid foods such as gelatin or soup. Progress to regular foods as tolerated. Avoid greasy, spicy, heavy foods. If nausea and/or vomiting occur, drink only clear liquids until the nausea and/or vomiting subsides. Call your physician if vomiting  continues.  Special Instructions/Symptoms: Your throat may feel dry or sore from the anesthesia or the breathing tube placed in your throat during surgery. If this causes discomfort, gargle with warm salt water. The discomfort should disappear within 24 hours.

## 2014-11-02 NOTE — Anesthesia Procedure Notes (Signed)
Procedure Name: LMA Insertion Date/Time: 11/02/2014 8:57 AM Performed by: Tyrone NineSAUVE, Pier Bosher F Pre-anesthesia Checklist: Patient identified, Timeout performed, Emergency Drugs available, Suction available and Patient being monitored Patient Re-evaluated:Patient Re-evaluated prior to inductionOxygen Delivery Method: Circle system utilized Preoxygenation: Pre-oxygenation with 100% oxygen Intubation Type: IV induction LMA: LMA inserted LMA Size: 4.0 Number of attempts: 1 Airway Equipment and Method: Bite block Placement Confirmation: breath sounds checked- equal and bilateral and positive ETCO2 Tube secured with: Tape Dental Injury: Teeth and Oropharynx as per pre-operative assessment

## 2014-11-02 NOTE — Op Note (Signed)
PATIENT:  [name]  PRE-OPERATIVE DIAGNOSIS: Left ureteral and renal stones  POST-OPERATIVE DIAGNOSIS: Same  PROCEDURE: Cysto, left retrograde ureteropyelogram, left ureteroscopy with laser and extraction of ureteral and renal stones, left J2 stent placement (24 cm by 6 fr contour with string)  SURGEON:  Bertram MillardStephen M. Katianne Barre, M.D.  ANESTHESIA:  General  EBL:  Minimal  DRAINS:  Previously noted stent  LOCAL MEDICATIONS USED:  None  SPECIMEN:   Stone fragments, given to the patient's wife  INDICATION: [name] DaughtersWilliam Wissinger is a  68 year old male with symptomatic left distal ureteral stones. Additionally, he has left renal calculi. The stone burden in the left distal ureter is quite great, and during recent visit I recommended that he have ureteroscopy rather than lithotripsy of these stones. Risks and complications of ureteroscopy with holmium laser energy application as well as stent placement were discussed with the patient. He understands the risks and desires to proceed.  Description of procedure: The patient was properly identified and marked (if applicable) in the holding area. They were then  taken to the operating room and placed on the table in a supine position. General anesthesia was then administered. Once fully anesthetized the patient was moved to the dorsolithotomy position and the genitalia and perineum were sterilely prepped and draped in standard fashion. An official timeout was then performed.   a 22 French panendoscope was advanced under direct vision through his urethra. The urethra was free of stricture or lesions. Bladder was entered. The prostatic urethra was normal. Circumferential inspection of the bladder was performed. He had no tumors, trabeculations or foreign bodies. Ureteral orifices  Were both normal in configuration and location.  A 6 French open-ended catheter was advanced and was left ureteral orifice. Retrograde ureteropyelogram was performed. This revealed 3  filling defects in the distal ureter. Most of the contrast injected, even under high-pressure, came out through the ureteral orifice back and of the bladder. Enough of the contrast filled the distal ureter where I could see there were filling defects consistent with stones. Minimal passage of contrast was noted proximal to be stones. Through the open-ended catheter, I negotiated a sensor-tip guidewire past the stones into the left renal pelvis using fluoroscopic guidance.  The cystoscope was then removed. I passed first the inner core and then the entire 12/14 ureteral access sheath assembly through the distal ureteral area to dilate the ureteral orifice. Following this, the access sheath was removed. A 6 French short ureteroscope was advanced to the patient's urethra and into his ureteral orifice. Several stones were identified. There were sequentially fragmented with the 200 microns laser fiber with settings of the laser being 30 Hz and 0.5 J. The stones were fragmented and a multiple smaller fragments which were snared with the 0 tip Nitinol basket and extracted and the bladder where there were released. Following fragmentation and removal of all stone burden in the ureter, the ureteroscope was removed. The ureteral access sheath was then advanced over the guidewire up the ureter. The guidewire and inner core were removed. The flexible digital ureteroscope was used to inspect the entire pyelocalyceal system. 3-4 stones were identified and removed. The largest of these was perhaps 3-4 mm.  They did not need to be fragmented, but were snared and extracted through the ureteral access catheter. No further stone burden was seen following inspection of all calyces. The scope was removed.   a guidewire was replaced , and the ureteral access catheter was removed. Using cystoscopic guidance, a 24 cm x  6 [place] contour double-J stent was placed. Good proximal and distal curls were seen fluoroscopically and  cystoscopically following removal of the guidewire. The string was left on the stent. I replaced the cystoscope and irrigated all tubal fragments of stones out of the patient's bladder, until they were fully removed from the bladder. The bladder was drained. The scope was removed. The string was taped to the patient's penis. He was awakened and taken to the PACU in stable condition. He tolerated the procedure well.    PLAN OF CARE: Discharge to home after PACU  PATIENT DISPOSITION:  PACU - hemodynamically stable.

## 2014-11-02 NOTE — Anesthesia Postprocedure Evaluation (Signed)
  Anesthesia Post-op Note  Patient: [name] DaughtersWilliam [name]  Procedure(s) Performed: Procedure(s) (LRB): CYSTOSCOPY WITH RETROGRADE PYELOGRAM, URETEROSCOPY, EXTRACTION OF STONES AND STENT PLACEMENT (Left) HOLMIUM LASER APPLICATION (Left)  Patient Location: PACU  Anesthesia Type: General  Level of Consciousness: awake and alert   Airway and Oxygen Therapy: Patient Spontanous Breathing  Post-op Pain: mild  Post-op Assessment: Post-op Vital signs reviewed, Patient's Cardiovascular Status Stable, Respiratory Function Stable, Patent Airway and No signs of Nausea or vomiting  Last Vitals:  Filed Vitals:   11/02/14 1045  BP: 114/74  Pulse: 60  Temp:   Resp: 11    Post-op Vital Signs: stable   Complications: No apparent anesthesia complications

## 2014-11-02 NOTE — Anesthesia Preprocedure Evaluation (Addendum)
Anesthesia Evaluation  Patient identified by MRN, date of birth, ID band Patient awake    Reviewed: Allergy & Precautions, H&P , NPO status , Patient's Chart, lab work & pertinent test results  Airway Mallampati: II  TM Distance: >3 FB Neck ROM: Full    Dental no notable dental hx. (+) Partial Lower, Partial Upper   Pulmonary pneumonia -, resolved,  breath sounds clear to auscultation  Pulmonary exam normal       Cardiovascular Exercise Tolerance: Good + angina + CAD and + CABG Rhythm:Regular Rate:Normal  S/P CABG May 2013     Neuro/Psych negative neurological ROS  negative psych ROS   GI/Hepatic negative GI ROS, Neg liver ROS,   Endo/Other  diabetes, Type 2, Oral Hypoglycemic Agents  Renal/GU negative Renal ROS     Musculoskeletal negative musculoskeletal ROS (+)   Abdominal   Peds  Hematology negative hematology ROS (+)   Anesthesia Other Findings   Reproductive/Obstetrics                         Anesthesia Physical  Anesthesia Plan  ASA: III  Anesthesia Plan: General   Post-op Pain Management:    Induction: Intravenous  Airway Management Planned: LMA  Additional Equipment:   Intra-op Plan:   Post-operative Plan: Extubation in OR  Informed Consent: I have reviewed the patients History and Physical, chart, labs and discussed the procedure including the risks, benefits and alternatives for the proposed anesthesia with the patient or authorized representative who has indicated his/her understanding and acceptance.   Dental advisory given  Plan Discussed with: CRNA  Anesthesia Plan Comments:         Anesthesia Quick Evaluation

## 2014-11-02 NOTE — OR Nursing (Signed)
Left ureteral stone taking by Dr. Dahlstedt. 

## 2014-11-02 NOTE — Transfer of Care (Signed)
Immediate Anesthesia Transfer of Care Note  Patient: [name]  Procedure(s) Performed: Procedure(s): CYSTOSCOPY WITH RETROGRADE PYELOGRAM, URETEROSCOPY, EXTRACTION OF STONES AND STENT PLACEMENT (Left) HOLMIUM LASER APPLICATION (Left)  Patient Location: PACU  Anesthesia Type:General  Level of Consciousness: awake, alert , oriented and patient cooperative  Airway & Oxygen Therapy: Patient Spontanous Breathing and Patient connected to nasal cannula oxygen  Post-op Assessment: Report given to PACU RN and Post -op Vital signs reviewed and stable  Post vital signs: Reviewed and stable  Complications: No apparent anesthesia complications

## 2014-11-02 NOTE — H&P (Signed)
Urology History and Physical Exam  CC: Left sided kidney stones  HPI: 68 year old male presents for endoscopic management of multiple left distal ureteral stones as well as a solitary left renal stone. He presented recently with left sided abd pain  As well as LUTS. KUB revealed the above findings.  PMH: Past Medical History  Diagnosis Date  . Gout   . History of kidney stones   . Type 2 diabetes mellitus   . History of skin cancer   . Left ureteral calculus   . S/P CABG x 3     04-19-2012  . Coronary artery disease 04/2012    CARDIOLOGIST--  dr Anne Fuskains  . Urgency of urination   . Wears partial dentures     upper and lower    PSH: Past Surgical History  Procedure Laterality Date  . Cardiovascular stress test  04/2012  . Knee arthroscopy Left 1994  . Coronary artery bypass graft  04/19/2012    Procedure: CORONARY ARTERY BYPASS GRAFTING (CABG);  Surgeon: Alleen BorneBryan K Bartle, MD;  [facility];  Service: Open Heart Surgery;  Laterality: N/A;  . Skin cancer excision  2014  . Cystoscopy with ureteroscopy, stone basketry and stent placement Right 12/01/2013    Procedure: CYSTOSCOPY WITH URETEROSCOPY, RIGHT RETROGRADE PYLEGRAM ,  STONE REMOVAL,  AND STENT PLACEMENT;  Surgeon: Marcine MatarStephen Macaela Presas, MD;  Location: [facility] ORS;  Service: Urology;  Laterality: Right;  . Holmium laser application Right 12/01/2013    Procedure: HOLMIUM LASER APPLICATION;  Surgeon: Marcine MatarStephen Maythe Deramo, MD;  Location: [facility] OR[facility];  Service: Urology;  Laterality: Right;  . Cardiac catheterization  04/16/2012 dr Anne Fuskains    severe 3 vessel CAD/  pLAD 90%,  pRCA 95%, total occluded CFX/  normal LVSF/  diastolic dysfunction  . Ureteroscopic stone extraction w/ stent placement Bilateral bilateral 06-23-2011//  right 09-16-2007//  left 07-02-2009  . Cystolithapaxy w/ holmium laser and transurethral incision of prostate  10-23-2006    Allergies: No Known Allergies  Medications: No prescriptions prior to admission     Social  History: History   Social History  . Marital Status: Married    Spouse Name: N/A    Number of Children: N/A  . Years of Education: N/A   Occupational History  . Not on file.   Social History Main Topics  . Smoking status: Never Smoker   . Smokeless tobacco: Never Used  . Alcohol Use: Yes     Comment: occasional  . Drug Use: No  . Sexual Activity: Not on file   Other Topics Concern  . Not on file   Social History Narrative    Family History: Family History  Problem Relation Age of Onset  . Kidney Stones    . Heart failure    . Diabetes    . Alcohol abuse Father   . Congestive Heart Failure Mother   . Heart attack Maternal Uncle     multiple mat. uncles in their 0's     Review of Systems: Positive: Left LLQ pain, dysuria, LUTS Negative: *  A further 10 point review of systems was negative except what is listed in the HPI.                  Physical Exam: @VITALS2 @ General: No acute distress.  Awake. Head:  Normocephalic.  Atraumatic. ENT:  EOMI.  Mucous membranes moist Neck:  Supple.  No lymphadenopathy. CV:  S1 present. S2 present. Regular rate. Pulmonary: Equal effort bilaterally.  Clear to  auscultation bilaterally. Abdomen: Soft.  Nontender to palpation. Skin:  Normal turgor.  No visible rash. Extremity: No gross deformity of bilateral upper extremities.  No gross deformity of                             lower extremities. Neurologic: Alert. Appropriate mood.    Studies:  No results for input(s): HGB, WBC, PLT in the last 72 hours.  No results for input(s): NA, K, CL, CO2, BUN, CREATININE, CALCIUM, GFRNONAA, GFRAA in the last 72 hours.  Invalid input(s): MAGNESIUM   No results for input(s): INR, APTT in the last 72 hours.  Invalid input(s): PT   Invalid input(s): ABG    Assessment:  Multiple left ureteral and renal calculi  Plan: Left ureteroscopic management w/ laser and extraction of calculi

## 2014-11-03 ENCOUNTER — Encounter (HOSPITAL_BASED_OUTPATIENT_CLINIC_OR_DEPARTMENT_OTHER): Payer: Self-pay | Admitting: Urology

## 2014-11-19 ENCOUNTER — Encounter (HOSPITAL_COMMUNITY): Payer: Self-pay | Admitting: Cardiology

## 2015-11-24 DIAGNOSIS — N201 Calculus of ureter: Secondary | ICD-10-CM | POA: Diagnosis not present

## 2015-12-29 ENCOUNTER — Other Ambulatory Visit: Payer: Self-pay | Admitting: Urology

## 2016-01-10 ENCOUNTER — Encounter (HOSPITAL_BASED_OUTPATIENT_CLINIC_OR_DEPARTMENT_OTHER): Payer: Self-pay | Admitting: *Deleted

## 2016-01-11 NOTE — Progress Notes (Signed)
SPOKE W/ PT'S WIFE .  STATES THAT PT PASSED A LOT OF STONES TODAY AND HAS APPOINTMENT AT OFFICE TOMORROW 01-12-2016. POSSIBLE CANCEL.

## 2016-01-17 ENCOUNTER — Encounter (HOSPITAL_BASED_OUTPATIENT_CLINIC_OR_DEPARTMENT_OTHER): Payer: Self-pay

## 2016-01-17 ENCOUNTER — Ambulatory Visit (HOSPITAL_BASED_OUTPATIENT_CLINIC_OR_DEPARTMENT_OTHER): Admit: 2016-01-17 | Payer: Medicare PPO | Admitting: Urology

## 2016-01-17 SURGERY — CYSTOSCOPY, WITH CALCULUS REMOVAL USING BASKET
Anesthesia: General | Laterality: Left

## 2016-12-25 ENCOUNTER — Emergency Department (HOSPITAL_COMMUNITY): Payer: Medicare Other

## 2016-12-25 ENCOUNTER — Inpatient Hospital Stay (HOSPITAL_COMMUNITY)
Admission: EM | Admit: 2016-12-25 | Discharge: 2016-12-29 | DRG: 581 | Disposition: A | Discharge status: Home / Self Care | Payer: Medicare Other | Attending: Otolaryngology | Admitting: Otolaryngology

## 2016-12-25 ENCOUNTER — Encounter (HOSPITAL_COMMUNITY): Payer: Self-pay | Admitting: Emergency Medicine

## 2016-12-25 ENCOUNTER — Encounter (HOSPITAL_COMMUNITY)
Admission: EM | Disposition: A | Discharge status: Home / Self Care | Payer: Self-pay | Source: Home / Self Care | Attending: Otolaryngology

## 2016-12-25 ENCOUNTER — Emergency Department (HOSPITAL_COMMUNITY): Payer: Medicare Other | Admitting: Anesthesiology

## 2016-12-25 DIAGNOSIS — Z811 Family history of alcohol abuse and dependence: Secondary | ICD-10-CM

## 2016-12-25 DIAGNOSIS — Z85828 Personal history of other malignant neoplasm of skin: Secondary | ICD-10-CM | POA: Diagnosis not present

## 2016-12-25 DIAGNOSIS — Z87442 Personal history of urinary calculi: Secondary | ICD-10-CM | POA: Diagnosis not present

## 2016-12-25 DIAGNOSIS — Z833 Family history of diabetes mellitus: Secondary | ICD-10-CM | POA: Diagnosis not present

## 2016-12-25 DIAGNOSIS — Z951 Presence of aortocoronary bypass graft: Secondary | ICD-10-CM

## 2016-12-25 DIAGNOSIS — M109 Gout, unspecified: Secondary | ICD-10-CM | POA: Diagnosis present

## 2016-12-25 DIAGNOSIS — I2510 Atherosclerotic heart disease of native coronary artery without angina pectoris: Secondary | ICD-10-CM | POA: Diagnosis present

## 2016-12-25 DIAGNOSIS — E119 Type 2 diabetes mellitus without complications: Secondary | ICD-10-CM | POA: Diagnosis present

## 2016-12-25 DIAGNOSIS — Z79899 Other long term (current) drug therapy: Secondary | ICD-10-CM | POA: Diagnosis not present

## 2016-12-25 DIAGNOSIS — L0201 Cutaneous abscess of face: Secondary | ICD-10-CM | POA: Diagnosis present

## 2016-12-25 DIAGNOSIS — K122 Cellulitis and abscess of mouth: Secondary | ICD-10-CM

## 2016-12-25 DIAGNOSIS — M542 Cervicalgia: Secondary | ICD-10-CM | POA: Diagnosis present

## 2016-12-25 DIAGNOSIS — Z841 Family history of disorders of kidney and ureter: Secondary | ICD-10-CM

## 2016-12-25 DIAGNOSIS — Z98818 Other dental procedure status: Secondary | ICD-10-CM

## 2016-12-25 DIAGNOSIS — Z972 Presence of dental prosthetic device (complete) (partial): Secondary | ICD-10-CM

## 2016-12-25 DIAGNOSIS — R509 Fever, unspecified: Secondary | ICD-10-CM

## 2016-12-25 DIAGNOSIS — Z8249 Family history of ischemic heart disease and other diseases of the circulatory system: Secondary | ICD-10-CM | POA: Diagnosis not present

## 2016-12-25 DIAGNOSIS — Z7982 Long term (current) use of aspirin: Secondary | ICD-10-CM

## 2016-12-25 DIAGNOSIS — I10 Essential (primary) hypertension: Secondary | ICD-10-CM | POA: Diagnosis present

## 2016-12-25 DIAGNOSIS — Z7984 Long term (current) use of oral hypoglycemic drugs: Secondary | ICD-10-CM

## 2016-12-25 HISTORY — PX: INCISION AND DRAINAGE ABSCESS: SHX5864

## 2016-12-25 LAB — CBC WITH DIFFERENTIAL/PLATELET
Basophils Absolute: 0 10*3/uL (ref 0.0–0.1)
Basophils Relative: 0 %
Eosinophils Absolute: 0 10*3/uL (ref 0.0–0.7)
Eosinophils Relative: 0 %
HCT: 43 % (ref 39.0–52.0)
HEMOGLOBIN: 14.5 g/dL (ref 13.0–17.0)
Lymphocytes Relative: 7 %
Lymphs Abs: 0.7 10*3/uL (ref 0.7–4.0)
MCH: 32.5 pg (ref 26.0–34.0)
MCHC: 33.7 g/dL (ref 30.0–36.0)
MCV: 96.4 fL (ref 78.0–100.0)
Monocytes Absolute: 1.3 10*3/uL — ABNORMAL HIGH (ref 0.1–1.0)
Monocytes Relative: 13 %
NEUTROS PCT: 80 %
Neutro Abs: 7.8 10*3/uL — ABNORMAL HIGH (ref 1.7–7.7)
Platelets: 154 10*3/uL (ref 150–400)
RBC: 4.46 MIL/uL (ref 4.22–5.81)
RDW: 13.4 % (ref 11.5–15.5)
WBC: 9.8 10*3/uL (ref 4.0–10.5)

## 2016-12-25 LAB — COMPREHENSIVE METABOLIC PANEL
ALT: 18 U/L (ref 17–63)
AST: 27 U/L (ref 15–41)
Albumin: 3.2 g/dL — ABNORMAL LOW (ref 3.5–5.0)
Alkaline Phosphatase: 52 U/L (ref 38–126)
Anion gap: 13 (ref 5–15)
BILIRUBIN TOTAL: 0.8 mg/dL (ref 0.3–1.2)
BUN: 16 mg/dL (ref 6–20)
CO2: 22 mmol/L (ref 22–32)
Calcium: 9.2 mg/dL (ref 8.9–10.3)
Chloride: 100 mmol/L — ABNORMAL LOW (ref 101–111)
Creatinine, Ser: 1.24 mg/dL (ref 0.61–1.24)
GFR calc non Af Amer: 58 mL/min — ABNORMAL LOW (ref >60)
Glucose, Bld: 172 mg/dL — ABNORMAL HIGH (ref 65–99)
POTASSIUM: 3.6 mmol/L (ref 3.5–5.1)
Sodium: 135 mmol/L (ref 135–145)
TOTAL PROTEIN: 7 g/dL (ref 6.5–8.1)

## 2016-12-25 LAB — URINALYSIS, ROUTINE W REFLEX MICROSCOPIC
Bilirubin Urine: NEGATIVE
GLUCOSE, UA: NEGATIVE mg/dL
Hgb urine dipstick: NEGATIVE
Ketones, ur: 5 mg/dL — AB
LEUKOCYTES UA: NEGATIVE
Nitrite: NEGATIVE
Protein, ur: NEGATIVE mg/dL
Specific Gravity, Urine: >1.046 — ABNORMAL HIGH (ref 1.005–1.030)
pH: 5 (ref 5.0–8.0)

## 2016-12-25 LAB — INFLUENZA PANEL BY PCR (TYPE A & B)
INFLAPCR: NEGATIVE
Influenza B By PCR: NEGATIVE

## 2016-12-25 LAB — GLUCOSE, CAPILLARY: Glucose-Capillary: 159 mg/dL — ABNORMAL HIGH (ref 65–99)

## 2016-12-25 LAB — I-STAT CG4 LACTIC ACID, ED
LACTIC ACID, VENOUS: 1.29 mmol/L (ref 0.5–1.9)
LACTIC ACID, VENOUS: 1.05 mmol/L (ref 0.5–1.9)

## 2016-12-25 SURGERY — INCISION AND DRAINAGE, ABSCESS
Anesthesia: General | Site: Neck

## 2016-12-25 MED ORDER — SUCCINYLCHOLINE CHLORIDE 20 MG/ML IJ SOLN
INTRAMUSCULAR | Status: DC | PRN
  Administered 2016-12-25: 80 mg via INTRAVENOUS

## 2016-12-25 MED ORDER — HYDROCODONE-ACETAMINOPHEN 5-325 MG PO TABS: 1.0000 | ORAL_TABLET | Freq: Three times a day (TID) | ORAL | Status: DC | PRN

## 2016-12-25 MED ORDER — ALLOPURINOL 100 MG PO TABS
100.0000 mg | ORAL_TABLET | Freq: Every day | ORAL | Status: DC
  Administered 2016-12-26 – 2016-12-29 (×4): 100 mg via ORAL
  Filled 2016-12-25 (×4): qty 1

## 2016-12-25 MED ORDER — BUPIVACAINE-EPINEPHRINE (PF) 0.5% -1:200000 IJ SOLN
INTRAMUSCULAR | Status: AC
  Filled 2016-12-25: qty 30

## 2016-12-25 MED ORDER — ATORVASTATIN CALCIUM 20 MG PO TABS
20.0000 mg | ORAL_TABLET | Freq: Every day | ORAL | Status: DC
  Administered 2016-12-26 – 2016-12-27 (×2): 20 mg via ORAL
  Filled 2016-12-25 (×3): qty 1

## 2016-12-25 MED ORDER — POTASSIUM CHLORIDE IN NACL 20-0.45 MEQ/L-% IV SOLN
INTRAVENOUS | Status: DC
Start: 2016-12-25 — End: 2016-12-29
  Administered 2016-12-26 (×2): via INTRAVENOUS
  Filled 2016-12-25 (×2): qty 1000

## 2016-12-25 MED ORDER — ASPIRIN EC 325 MG PO TBEC
325.0000 mg | DELAYED_RELEASE_TABLET | Freq: Every day | ORAL | Status: DC
  Administered 2016-12-26 – 2016-12-29 (×4): 325 mg via ORAL
  Filled 2016-12-25 (×4): qty 1

## 2016-12-25 MED ORDER — LACTATED RINGERS IV SOLN
INTRAVENOUS | Status: DC | PRN
  Administered 2016-12-25: 22:00:00 via INTRAVENOUS

## 2016-12-25 MED ORDER — FENTANYL CITRATE (PF) 250 MCG/5ML IJ SOLN
INTRAMUSCULAR | Status: DC | PRN
  Administered 2016-12-25: 100 ug via INTRAVENOUS

## 2016-12-25 MED ORDER — IOPAMIDOL (ISOVUE-300) INJECTION 61%
INTRAVENOUS | Status: AC
Start: 2016-12-25 — End: 2016-12-25
  Administered 2016-12-25: 75 mL
  Filled 2016-12-25: qty 75

## 2016-12-25 MED ORDER — LIDOCAINE HCL (CARDIAC) 20 MG/ML IV SOLN
INTRAVENOUS | Status: DC | PRN
  Administered 2016-12-25: 60 mg via INTRATRACHEAL

## 2016-12-25 MED ORDER — INSULIN ASPART 100 UNIT/ML ~~LOC~~ SOLN
0.0000 [IU] | Freq: Three times a day (TID) | SUBCUTANEOUS | Status: DC
  Administered 2016-12-26 (×2): 3 [IU] via SUBCUTANEOUS

## 2016-12-25 MED ORDER — FENTANYL CITRATE (PF) 100 MCG/2ML IJ SOLN: 25.0000 ug | INTRAMUSCULAR | Status: DC | PRN

## 2016-12-25 MED ORDER — FENTANYL CITRATE (PF) 100 MCG/2ML IJ SOLN
INTRAMUSCULAR | Status: AC
  Filled 2016-12-25: qty 2

## 2016-12-25 MED ORDER — IBUPROFEN 200 MG PO TABS: 800.0000 mg | ORAL_TABLET | Freq: Four times a day (QID) | ORAL | Status: DC | PRN

## 2016-12-25 MED ORDER — OXYCODONE HCL 5 MG PO TABS: 5.0000 mg | ORAL_TABLET | Freq: Once | ORAL | Status: DC | PRN

## 2016-12-25 MED ORDER — METFORMIN HCL ER 500 MG PO TB24
500.0000 mg | ORAL_TABLET | Freq: Two times a day (BID) | ORAL | Status: DC
  Administered 2016-12-26 – 2016-12-29 (×6): 500 mg via ORAL
  Filled 2016-12-25 (×7): qty 1

## 2016-12-25 MED ORDER — CLINDAMYCIN PHOSPHATE 600 MG/50ML IV SOLN
600.0000 mg | Freq: Three times a day (TID) | INTRAVENOUS | Status: DC
  Administered 2016-12-26 – 2016-12-29 (×10): 600 mg via INTRAVENOUS
  Filled 2016-12-25 (×13): qty 50

## 2016-12-25 MED ORDER — ONDANSETRON HCL 4 MG/2ML IJ SOLN
INTRAMUSCULAR | Status: DC | PRN
  Administered 2016-12-25: 4 mg via INTRAVENOUS

## 2016-12-25 MED ORDER — OXYCODONE HCL 5 MG/5ML PO SOLN: 5.0000 mg | Freq: Once | ORAL | Status: DC | PRN

## 2016-12-25 MED ORDER — PROPOFOL 10 MG/ML IV BOLUS
INTRAVENOUS | Status: DC | PRN
  Administered 2016-12-25 (×2): 40 mg via INTRAVENOUS
  Administered 2016-12-25: 160 mg via INTRAVENOUS

## 2016-12-25 MED ORDER — INSULIN ASPART 100 UNIT/ML ~~LOC~~ SOLN
0.0000 [IU] | Freq: Every day | SUBCUTANEOUS | Status: DC
  Administered 2016-12-28: 2 [IU] via SUBCUTANEOUS

## 2016-12-25 MED ORDER — MIDAZOLAM HCL 2 MG/2ML IJ SOLN
INTRAMUSCULAR | Status: AC
  Filled 2016-12-25: qty 2

## 2016-12-25 MED ORDER — 0.9 % SODIUM CHLORIDE (POUR BTL) OPTIME
TOPICAL | Status: DC | PRN
  Administered 2016-12-25: 1000 mL

## 2016-12-25 MED ORDER — MIDAZOLAM HCL 5 MG/5ML IJ SOLN
INTRAMUSCULAR | Status: DC | PRN
  Administered 2016-12-25: 2 mg via INTRAVENOUS

## 2016-12-25 MED ORDER — MORPHINE SULFATE (PF) 2 MG/ML IV SOLN: 2.0000 mg | INTRAVENOUS | Status: DC | PRN

## 2016-12-25 MED ORDER — CLINDAMYCIN PHOSPHATE 900 MG/50ML IV SOLN
900.0000 mg | Freq: Once | INTRAVENOUS | Status: AC
  Administered 2016-12-25: 900 mg via INTRAVENOUS
  Filled 2016-12-25: qty 50

## 2016-12-25 MED ORDER — BUPIVACAINE-EPINEPHRINE 0.5% -1:200000 IJ SOLN
INTRAMUSCULAR | Status: DC | PRN
  Administered 2016-12-25: 1 mL

## 2016-12-25 SURGICAL SUPPLY — 31 items
BLADE SURG 15 STRL LF DISP TIS (BLADE) ×1 IMPLANT
BLADE SURG 15 STRL SS (BLADE) ×3
BNDG GAUZE ELAST 4 BULKY (GAUZE/BANDAGES/DRESSINGS) ×3 IMPLANT
CATH ROBINSON RED A/P 12FR (CATHETERS) ×3 IMPLANT
COVER SURGICAL LIGHT HANDLE (MISCELLANEOUS) ×6 IMPLANT
DRAIN PENROSE 1/4X12 LTX STRL (WOUND CARE) ×3 IMPLANT
DRAPE ORTHO SPLIT 77X108 STRL (DRAPES) ×3
DRAPE SURG ORHT 6 SPLT 77X108 (DRAPES) ×1 IMPLANT
ELECT COATED BLADE 2.86 ST (ELECTRODE) ×3 IMPLANT
ELECT REM PT RETURN 9FT ADLT (ELECTROSURGICAL) ×3
ELECTRODE REM PT RTRN 9FT ADLT (ELECTROSURGICAL) ×1 IMPLANT
GAUZE SPONGE 4X4 16PLY XRAY LF (GAUZE/BANDAGES/DRESSINGS) ×3 IMPLANT
GLOVE BIO SURGEON STRL SZ7.5 (GLOVE) ×3 IMPLANT
GOWN STRL REUS W/ TWL LRG LVL3 (GOWN DISPOSABLE) ×2 IMPLANT
GOWN STRL REUS W/TWL LRG LVL3 (GOWN DISPOSABLE) ×6
KIT BASIN OR (CUSTOM PROCEDURE TRAY) ×3 IMPLANT
KIT ROOM TURNOVER OR (KITS) ×3 IMPLANT
NEEDLE HYPO 25GX1X1/2 BEV (NEEDLE) ×3 IMPLANT
NS IRRIG 1000ML POUR BTL (IV SOLUTION) ×3 IMPLANT
PACK SURGICAL SETUP 50X90 (CUSTOM PROCEDURE TRAY) ×3 IMPLANT
PAD ARMBOARD 7.5X6 YLW CONV (MISCELLANEOUS) ×6 IMPLANT
PENCIL BUTTON HOLSTER BLD 10FT (ELECTRODE) ×3 IMPLANT
SUCTION FRAZIER TIP 10 FR DISP (SUCTIONS) ×3 IMPLANT
SUT ETHILON 2 0 FS 18 (SUTURE) ×3 IMPLANT
SWAB COLLECTION DEVICE MRSA (MISCELLANEOUS) ×3 IMPLANT
SYR BULB IRRIGATION 50ML (SYRINGE) ×3 IMPLANT
SYR CONTROL 10ML LL (SYRINGE) ×3 IMPLANT
TUBE ANAEROBIC SPECIMEN COL (MISCELLANEOUS) ×3 IMPLANT
TUBE CONNECTING 12'X1/4 (SUCTIONS) ×1
TUBE CONNECTING 12X1/4 (SUCTIONS) ×2 IMPLANT
YANKAUER SUCT BULB TIP NO VENT (SUCTIONS) ×3 IMPLANT

## 2016-12-25 NOTE — Anesthesia Preprocedure Evaluation (Signed)
Anesthesia Evaluation  Patient identified by MRN, date of birth, ID band Patient awake    Reviewed: Allergy & Precautions, H&P , NPO status , Patient's Chart, lab work & pertinent test results  History of Anesthesia Complications Negative for: history of anesthetic complications  Airway Mallampati: IV  TM Distance: >3 FB Neck ROM: Limited  Mouth opening: Limited Mouth Opening  Dental  (+) Partial Lower, Partial Upper   Pulmonary pneumonia, resolved,    breath sounds clear to auscultation       Cardiovascular Exercise Tolerance: Good hypertension, (-) angina+ CAD and + CABG  Normal cardiovascular exam Rhythm:Regular Rate:Normal  S/P CABG May 2013     Neuro/Psych negative neurological ROS  negative psych ROS   GI/Hepatic negative GI ROS, Neg liver ROS,   Endo/Other  diabetes, Type 2, Oral Hypoglycemic Agents  Renal/GU negative Renal ROS     Musculoskeletal negative musculoskeletal ROS (+)   Abdominal   Peds  Hematology negative hematology ROS (+)   Anesthesia Other Findings   Reproductive/Obstetrics                             Anesthesia Physical Anesthesia Plan  ASA: III and emergent  Anesthesia Plan: General   Post-op Pain Management:    Induction: Intravenous and Rapid sequence  Airway Management Planned: Oral ETT and Video Laryngoscope Planned  Additional Equipment: None  Intra-op Plan:   Post-operative Plan: Extubation in OR  Informed Consent: I have reviewed the patients History and Physical, chart, labs and discussed the procedure including the risks, benefits and alternatives for the proposed anesthesia with the patient or authorized representative who has indicated his/her understanding and acceptance.   Dental advisory given  Plan Discussed with: CRNA and Surgeon  Anesthesia Plan Comments:         Anesthesia Quick Evaluation

## 2016-12-25 NOTE — Anesthesia Procedure Notes (Signed)
Procedure Name: Intubation Date/Time: 12/25/2016 9:52 PM Performed by: Brien MatesMAHONY, Manessa Buley D Pre-anesthesia Checklist: Patient identified, Emergency Drugs available, Suction available, Patient being monitored and Timeout performed Patient Re-evaluated:Patient Re-evaluated prior to inductionOxygen Delivery Method: Circle system utilized Preoxygenation: Pre-oxygenation with 100% oxygen Intubation Type: IV induction, Cricoid Pressure applied and Rapid sequence Laryngoscope Size: Miller, 2 and Glidescope (DL x 1 with Hyacinth MeekerMiller 2 -grade 3/view with no attempt to place ETT. DL x 1 with Glide- grade 1 view and easy placement of ETT) Grade View: Grade I Tube type: Oral Tube size: 7.5 mm Number of attempts: 1 Airway Equipment and Method: Stylet and Video-laryngoscopy Placement Confirmation: ETT inserted through vocal cords under direct vision,  positive ETCO2 and breath sounds checked- equal and bilateral Secured at: 22 cm Tube secured with: Tape Dental Injury: Teeth and Oropharynx as per pre-operative assessment

## 2016-12-25 NOTE — Anesthesia Preprocedure Evaluation (Addendum)
Anesthesia Evaluation  Patient identified by MRN, date of birth, ID band Patient awake    Reviewed: Allergy & Precautions, NPO status , Patient's Chart, lab work & pertinent test results, reviewed documented beta blocker date and time   History of Anesthesia Complications Negative for: history of anesthetic complications  Airway Mallampati: IV  TM Distance: >3 FB Neck ROM: Full Positive for:  Tracheal deviation Mouth opening: Limited Mouth Opening  Dental  (+) Partial Upper, Partial Lower, Dental Advisory Given   Pulmonary neg pulmonary ROS,    breath sounds clear to auscultation       Cardiovascular + angina + CAD and + CABG   Rhythm:Regular     Neuro/Psych negative neurological ROS  negative psych ROS   GI/Hepatic negative GI ROS, Neg liver ROS,   Endo/Other  diabetes  Renal/GU negative Renal ROS     Musculoskeletal   Abdominal   Peds  Hematology   Anesthesia Other Findings   Reproductive/Obstetrics                                                             Anesthesia Evaluation  Patient identified by MRN, date of birth, ID band Patient awake    Reviewed: Allergy & Precautions, H&P , NPO status , Patient's Chart, lab work & pertinent test results  Airway Mallampati: II TM Distance: >3 FB Neck ROM: Full    Dental no notable dental hx.    Pulmonary pneumonia -, resolved,  breath sounds clear to auscultation  Pulmonary exam normal       Cardiovascular Exercise Tolerance: Good + angina + CAD negative cardio ROS  Rhythm:Regular Rate:Normal  S/P CABG May 2013  ECG and CXR reviewed.   Neuro/Psych negative neurological ROS  negative psych ROS   GI/Hepatic negative GI ROS, Neg liver ROS,   Endo/Other  diabetes, Type 2, Oral Hypoglycemic Agents  Renal/GU negative Renal ROS  negative genitourinary   Musculoskeletal negative musculoskeletal ROS (+)   Abdominal   Peds negative pediatric ROS (+)  Hematology negative hematology ROS (+)   Anesthesia Other Findings   Reproductive/Obstetrics negative OB ROS                           Anesthesia Physical Anesthesia Plan  ASA: III  Anesthesia Plan: General   Post-op Pain Management:    Induction: Intravenous  Airway Management Planned: LMA  Additional Equipment:   Intra-op Plan:   Post-operative Plan: Extubation in OR  Informed Consent: I have reviewed the patients History and Physical, chart, labs and discussed the procedure including the risks, benefits and alternatives for the proposed anesthesia with the patient or authorized representative who has indicated his/her understanding and acceptance.   Dental advisory given  Plan Discussed with: CRNA  Anesthesia Plan Comments:         Anesthesia Quick Evaluation  Anesthesia Physical Anesthesia Plan  ASA: III and emergent  Anesthesia Plan: General   Post-op Pain Management:    Induction: Intravenous, Rapid sequence and Cricoid pressure planned  Airway Management Planned: Oral ETT  Additional Equipment: None  Intra-op Plan:   Post-operative Plan: Extubation in OR  Informed Consent: I have reviewed the patients History and Physical, chart, labs and discussed the procedure including the risks, benefits  and alternatives for the proposed anesthesia with the patient or authorized representative who has indicated his/her understanding and acceptance.   Dental advisory given  Plan Discussed with: CRNA, Anesthesiologist and Surgeon  Anesthesia Plan Comments:        Anesthesia Quick Evaluation

## 2016-12-25 NOTE — ED Provider Notes (Signed)
[facility]-EMERGENCY DEPT Provider Note  CSN: [id number] Arrival date & time: 12/25/16  1513  History   Chief Complaint Chief Complaint  Patient presents with  . Facial Swelling   HPI [name] is a 71 y.o. male.  The history is provided by the patient. No language interpreter was used.  Illness  This is a new problem. The current episode started more than 2 days ago. The problem occurs constantly. The problem has been gradually improving. Associated symptoms include headaches. Pertinent negatives include no chest pain, no abdominal pain and no shortness of breath. The symptoms are aggravated by eating, swallowing and drinking. Nothing relieves the symptoms.    Past Medical History:  Diagnosis Date  . Coronary artery disease    CARDIOLOGIST--  dr Anne Fu  . Gout   . History of kidney stones   . History of skin cancer   . Left ureteral calculus   . S/P CABG x 3    04-19-2012  . Type 2 diabetes mellitus (HCC)   . Urgency of urination   . Wears partial dentures    upper and lower   Patient Active Problem List   Diagnosis Date Noted  . Submental abscess 12/25/2016  . Intermediate coronary syndrome (HCC) 04/16/2012  . Coronary atherosclerosis of native coronary artery 04/16/2012  . Type II or unspecified type diabetes mellitus without mention of complication, not stated as uncontrolled 04/16/2012   Past Surgical History:  Procedure Laterality Date  . CARDIAC CATHETERIZATION  04/16/2012 dr Anne Fu   severe 3 vessel CAD/  pLAD 90%,  pRCA 95%, total occluded CFX/  normal LVSF/  diastolic dysfunction  . CARDIOVASCULAR STRESS TEST  04/2012  . CORONARY ARTERY BYPASS GRAFT  04/19/2012   Procedure: CORONARY ARTERY BYPASS GRAFTING (CABG);  Surgeon: Alleen Borne, MD;  Location: [facility] OR;  Service: Open Heart Surgery;  Laterality: N/A; LIMA to LAD, SVG to CFX, SVG to PDA  . CYSTOLITHAPAXY W/ HOLMIUM LASER AND TRANSURETHRAL INCISION OF PROSTATE  10-23-2006  . CYSTOSCOPY WITH RETROGRADE  PYELOGRAM, URETEROSCOPY AND STENT PLACEMENT Left 11/02/2014   Procedure: CYSTOSCOPY WITH RETROGRADE PYELOGRAM, URETEROSCOPY, EXTRACTION OF STONES AND STENT PLACEMENT;  Surgeon: [name], MD;  Location: [facility];  Service: Urology;  Laterality: Left;  . CYSTOSCOPY WITH URETEROSCOPY, STONE BASKETRY AND STENT PLACEMENT Right 12/01/2013   Procedure: CYSTOSCOPY WITH URETEROSCOPY, RIGHT RETROGRADE PYLEGRAM ,  STONE REMOVAL,  AND STENT PLACEMENT;  Surgeon: Marcine Matar, MD;  Location: [facility] OR[facility];  Service: Urology;  Laterality: Right;  . HOLMIUM LASER APPLICATION Right 12/01/2013   Procedure: HOLMIUM LASER APPLICATION;  Surgeon: Marcine Matar, MD;  Location: [facility] OR[facility];  Service: Urology;  Laterality: Right;  . HOLMIUM LASER APPLICATION Left 11/02/2014   Procedure: HOLMIUM LASER APPLICATION;  Surgeon: [name], MD;  Location: [facility];  Service: Urology;  Laterality: Left;  . KNEE ARTHROSCOPY Left 1994  . LEFT HEART CATHETERIZATION WITH CORONARY ANGIOGRAM N/A 04/16/2012   Procedure: LEFT HEART CATHETERIZATION WITH CORONARY ANGIOGRAM;  Surgeon: Donato Schultz, MD;  Location: [facility] CATH LAB;  Service: Cardiovascular;  Laterality: N/A;  . SKIN CANCER EXCISION  2014  . URETEROSCOPIC STONE EXTRACTION W/ STENT PLACEMENT Bilateral bilateral 06-23-2011//  right 09-16-2007//  left 07-02-2009   Home Medications    Prior to Admission medications   Medication Sig Start Date End Date Taking? Authorizing Provider  allopurinol (ZYLOPRIM) 100 MG tablet Take 100 mg by mouth daily.    Yes Historical Provider, MD  aspirin 325 MG  EC tablet Take 325 mg by mouth daily.   Yes Historical Provider, MD  atorvastatin (LIPITOR) 20 MG tablet Take 20 mg by mouth daily with supper.    Yes Historical Provider, MD  HYDROcodone-acetaminophen (NORCO/VICODIN) 5-325 MG tablet Take 1 tablet by mouth every 8 (eight) hours as needed (dental pain).   Yes Historical Provider, MD  ibuprofen  (ADVIL,MOTRIN) 200 MG tablet Take 200 mg by mouth at bedtime as needed (excercise pain).   Yes Historical Provider, MD  ibuprofen (ADVIL,MOTRIN) 800 MG tablet Take 800 mg by mouth every 6 (six) hours as needed (pain).   Yes Historical Provider, MD  metFORMIN (GLUCOPHAGE-XR) 500 MG 24 hr tablet Take 500 mg by mouth 2 (two) times daily with a meal.   Yes Historical Provider, MD   Family History Family History  Problem Relation Age of Onset  . Congestive Heart Failure Mother   . Alcohol abuse Father   . Kidney Stones    . Heart failure    . Diabetes    . Heart attack Maternal Uncle     multiple mat. uncles in their 74's    Social History Social History  Substance Use Topics  . Smoking status: Never Smoker  . Smokeless tobacco: Never Used  . Alcohol use Yes     Comment: occasional    Allergies   Patient has no known allergies.  Review of Systems Review of Systems  Constitutional: Positive for appetite change (decreased), chills, fatigue and fever.  HENT: Positive for congestion, sore throat, trouble swallowing and voice change.   Respiratory: Positive for cough. Negative for shortness of breath.   Cardiovascular: Negative for chest pain.  Gastrointestinal: Positive for diarrhea and nausea. Negative for abdominal pain.  Musculoskeletal: Positive for myalgias.  Neurological: Positive for headaches.  All other systems reviewed and are negative.   Physical Exam Updated Vital Signs BP (!) 142/78 (BP Location: Right Arm)   Pulse 84   Temp (!) 100.5 F (38.1 C) (Oral)   Resp 16   SpO2 93%   Physical Exam  Constitutional: He is oriented to person, place, and time. No distress.  Overweight elderly Caucasian male  HENT:  Head: Normocephalic and atraumatic.  Fullness to submental/submandibular area, posterior pharynx without edema or exudates, no tripoding on exam but 1.5 finger trismus, no overlying erythema  Eyes: EOM are normal. Pupils are equal, round, and reactive to light.   Neck: Normal range of motion. Neck supple.  Cardiovascular: Regular rhythm and normal heart sounds.   HR 90's  Pulmonary/Chest: Effort normal and breath sounds normal.  Abdominal: Soft. Bowel sounds are normal. He exhibits no distension. There is no tenderness.  Musculoskeletal: Normal range of motion.  Neurological: He is alert and oriented to person, place, and time.  Skin: Skin is warm and dry. Capillary refill takes less than 2 seconds. He is not diaphoretic.  Nursing note and vitals reviewed.   ED Treatments / Results  Labs (all labs ordered are listed, but only abnormal results are displayed) Labs Reviewed  COMPREHENSIVE METABOLIC PANEL - Abnormal; Notable for the following:       Result Value   Chloride 100 (*)    Glucose, Bld 172 (*)    Albumin 3.2 (*)    GFR calc non Af Amer 58 (*)    All other components within normal limits  CBC WITH DIFFERENTIAL/PLATELET - Abnormal; Notable for the following:    Neutro Abs 7.8 (*)    Monocytes Absolute 1.3 (*)  All other components within normal limits  URINALYSIS, ROUTINE W REFLEX MICROSCOPIC - Abnormal; Notable for the following:    Specific Gravity, Urine >1.046 (*)    Ketones, ur 5 (*)    All other components within normal limits  GLUCOSE, CAPILLARY - Abnormal; Notable for the following:    Glucose-Capillary 159 (*)    All other components within normal limits  CULTURE, BLOOD (ROUTINE X 2)  CULTURE, BLOOD (ROUTINE X 2)  URINE CULTURE  AEROBIC/ANAEROBIC CULTURE (SURGICAL/DEEP WOUND)  INFLUENZA PANEL BY PCR (TYPE A & B)  I-STAT CG4 LACTIC ACID, ED  I-STAT CG4 LACTIC ACID, ED   EKG  EKG Interpretation None      Radiology Ct Soft Tissue Neck W Contrast  Result Date: 12/25/2016 CLINICAL DATA:  Submental firmness with hoarse voice and difficulty handling secretions. Dysphagia. Recent dental procedure. EXAM: CT NECK WITH CONTRAST TECHNIQUE: Multidetector CT imaging of the neck was performed using the standard protocol  following the bolus administration of intravenous contrast. CONTRAST:  75mL ISOVUE-300 IOPAMIDOL (ISOVUE-300) INJECTION 61% COMPARISON:  None. FINDINGS: Pharynx and larynx: There is a 28 x 10 x 13 mm rim enhancing fluid collection along the lingual surface of the left mandible. A small focus of gas is present in this collection. There is evidence of recent extraction of a left mandibular molar with osseous lucency extending posteriorly through the posterior lingual cortex. The submandibular collection results in slight mass effect on the adjacent left floor of mouth. This may communicate with a fluid collection just left of midline in the submental space measuring 21 x 13 x 14 mm, and this second collection may communicate with a third smaller fluid collection to the right of midline more inferiorly in the submental space measuring 20 x 8 x 7 mm. Bilateral palatine tonsil calcifications are noted. The larynx is unremarkable. Salivary glands: No primary abnormality of the submandibular or parotid glands is identified. There is inflammatory stranding in the submandibular space bilaterally as well as in the subcutaneous tissues of the lower face and upper neck bilaterally with thickening of the platysma muscles. Thyroid: Unremarkable. Lymph nodes: Increased number of subcentimeter short axis lymph nodes bilaterally in the neck involving levels I -IV, likely reactive. Vascular: Major vascular structures of the neck appear patent. Limited intracranial: Unremarkable. Visualized orbits: Unremarkable. Mastoids and visualized paranasal sinuses: Minimal mucosal thickening in the paranasal sinuses. Clear mastoid air cells. Skeleton: Mild lower cervical spondylosis. Upper chest: Grossly clear lung apices. subcentimeter superior mediastinal lymph nodes. Other: None. IMPRESSION: 1. Left submandibular and submental space abscesses in the setting of recent left mandibular molar extraction. 2. Numerous small lymph nodes throughout  the neck and superior mediastinum, likely reactive. Electronically Signed   By: Sebastian Ache M.D.   On: 12/25/2016 18:09    Procedures Procedures (including critical care time)  Medications Ordered in ED Medications  allopurinol (ZYLOPRIM) tablet 100 mg (not administered)  atorvastatin (LIPITOR) tablet 20 mg (not administered)  aspirin EC tablet 325 mg (not administered)  HYDROcodone-acetaminophen (NORCO/VICODIN) 5-325 MG per tablet 1 tablet (not administered)  ibuprofen (ADVIL,MOTRIN) tablet 800 mg (not administered)  metFORMIN (GLUCOPHAGE-XR) 24 hr tablet 500 mg (not administered)  0.45 % NaCl with KCl 20 mEq / L infusion (not administered)  clindamycin (CLEOCIN) IVPB 600 mg (not administered)  morphine 2 MG/ML injection 2-4 mg (not administered)  insulin aspart (novoLOG) injection 0-15 Units (not administered)  insulin aspart (novoLOG) injection 0-5 Units (not administered)  clindamycin (CLEOCIN) IVPB 900 mg (0 mg Intravenous  Stopped 12/25/16 1926)  iopamidol (ISOVUE-300) 61 % injection (75 mLs  Contrast Given 12/25/16 1734)    Initial Impression / Assessment and Plan / ED Course  I have reviewed the triage vital signs and the nursing notes.  Pertinent labs & imaging results that were available during my care of the patient were reviewed by me and considered in my medical decision making (see chart for details).  Clinical Course   71 y.o. male with above stated PMHx, HPI, and physical. Symptom onset 5 days ago. Dental abscess to left lower tooth status post extraction. Since inpatient experiencing fever, chills, cough, congestion, nausea, vomiting, headache, body aches. Over past 24-48 hrs patient has now been experiencing worsening swelling of upper neck associated with hoarse voice, increased salivation and spitting, and dysphagia. Seen at urgent care and referred her facility for concern for ludwigs.  Temp 101.27F. Concern for deep tissue space infection, cellulitis versus abscess.  Possible extension given hoarse voice, dysphagia. Blood cultures drawn. Patient given IV clindamycin. CT scan showing multiple submandibular and submental abscesses. ENT consult evaluated the patient in the OR with recommendations for surgical drainage.  Laboratory and imaging results were personally reviewed by myself and used in the medical decision making of this patient's treatment and disposition.  Pt admitted to ENT for further evaluation and management of above. Pt understands and agrees with the plan and has no further questions or concerns.   Pt care discussed with and followed by my attending, Dr. Marily MemosJason Mesner  Angelina Okyan Bulmaro Feagans, MD Pager [phone]  Final Clinical Impressions(s) / ED Diagnoses   Final diagnoses:  Submandibular abscess  Submental abscess  Fever in adult   New Prescriptions Current Discharge Medication List       Angelina Okyan Tiersa Dayley, MD 12/25/16 2255    Marily MemosJason Mesner, MD 12/25/16 2321

## 2016-12-25 NOTE — ED Triage Notes (Signed)
Facial swelling x 1 week  Started with bad tooth now having neck swelling hard to swollow,  Was seen at dr today and he vomited today , told pt not to have anything to eat or drink

## 2016-12-25 NOTE — H&P (Signed)
[name] is an 71 y.o. male.   Chief Complaint: Neck abscess HPI: 71 year old male underwent extraction of left posterior molar extraction four days ago for tooth abscess.  He had localized swelling at the time.  Now, over the past two days, he has had worsening swelling and pain under the chin and inside the left floor of mouth.  He has noticed some fever and malaise as well.  He has been on penicillin.  Past Medical History:  Diagnosis Date  . Coronary artery disease    CARDIOLOGIST--  dr Marlou Porch  . Gout   . History of kidney stones   . History of skin cancer   . Left ureteral calculus   . S/P CABG x 3    04-19-2012  . Type 2 diabetes mellitus ([place])   . Urgency of urination   . Wears partial dentures    upper and lower    Past Surgical History:  Procedure Laterality Date  . CARDIAC CATHETERIZATION  04/16/2012 dr Marlou Porch   severe 3 vessel CAD/  pLAD 90%,  pRCA 95%, total occluded CFX/  normal LVSF/  diastolic dysfunction  . CARDIOVASCULAR STRESS TEST  04/2012  . CORONARY ARTERY BYPASS GRAFT  04/19/2012   Procedure: CORONARY ARTERY BYPASS GRAFTING (CABG);  Surgeon: Gaye Pollack, MD;  Location: [facility];  Service: Open Heart Surgery;  Laterality: N/A; LIMA to LAD, SVG to CFX, SVG to PDA  . CYSTOLITHAPAXY W/ HOLMIUM LASER AND TRANSURETHRAL INCISION OF PROSTATE  10-23-2006  . CYSTOSCOPY WITH RETROGRADE PYELOGRAM, URETEROSCOPY AND STENT PLACEMENT Left 11/02/2014   Procedure: CYSTOSCOPY WITH RETROGRADE PYELOGRAM, URETEROSCOPY, EXTRACTION OF STONES AND STENT PLACEMENT;  Surgeon: Jorja Loa, MD;  Location: [facility];  Service: Urology;  Laterality: Left;  . CYSTOSCOPY WITH URETEROSCOPY, STONE BASKETRY AND STENT PLACEMENT Right 12/01/2013   Procedure: CYSTOSCOPY WITH URETEROSCOPY, RIGHT RETROGRADE PYLEGRAM ,  STONE REMOVAL,  AND STENT PLACEMENT;  Surgeon: Franchot Gallo, MD;  Location: [facility];  Service: Urology;  Laterality: Right;  . HOLMIUM LASER APPLICATION  Right 51/01/5851   Procedure: HOLMIUM LASER APPLICATION;  Surgeon: Franchot Gallo, MD;  Location: [facility];  Service: Urology;  Laterality: Right;  . HOLMIUM LASER APPLICATION Left 77/82/4235   Procedure: HOLMIUM LASER APPLICATION;  Surgeon: Jorja Loa, MD;  Location: [facility];  Service: Urology;  Laterality: Left;  . KNEE ARTHROSCOPY Left 1994  . LEFT HEART CATHETERIZATION WITH CORONARY ANGIOGRAM N/A 04/16/2012   Procedure: LEFT HEART CATHETERIZATION WITH CORONARY ANGIOGRAM;  Surgeon: Candee Furbish, MD;  Location: [facility] CATH LAB;  Service: Cardiovascular;  Laterality: N/A;  . SKIN CANCER EXCISION  2014  . URETEROSCOPIC STONE EXTRACTION W/ STENT PLACEMENT Bilateral bilateral 06-23-2011//  right 09-16-2007//  left 07-02-2009    Family History  Problem Relation Age of Onset  . Congestive Heart Failure Mother   . Alcohol abuse Father   . Kidney Stones    . Heart failure    . Diabetes    . Heart attack Maternal Uncle     multiple mat. uncles in their 75's    Social History:  reports that he has never smoked. He has never used smokeless tobacco. He reports that he drinks alcohol. He reports that he does not use drugs.  Allergies: No Known Allergies   (Not in a hospital admission)  Results for orders placed or performed during the hospital encounter of 12/25/16 (from the past 48 hour(s))  Comprehensive metabolic panel     Status:  Abnormal   Collection Time: 12/25/16  3:45 PM  Result Value Ref Range   Sodium 135 135 - 145 mmol/L   Potassium 3.6 3.5 - 5.1 mmol/L   Chloride 100 (L) 101 - 111 mmol/L   CO2 22 22 - 32 mmol/L   Glucose, Bld 172 (H) 65 - 99 mg/dL   BUN 16 6 - 20 mg/dL   Creatinine, Ser 1.24 0.61 - 1.24 mg/dL   Calcium 9.2 8.9 - 10.3 mg/dL   Total Protein 7.0 6.5 - 8.1 g/dL   Albumin 3.2 (L) 3.5 - 5.0 g/dL   AST 27 15 - 41 U/L   ALT 18 17 - 63 U/L   Alkaline Phosphatase 52 38 - 126 U/L   Total Bilirubin 0.8 0.3 - 1.2 mg/dL   GFR calc non Af Amer 58  (L) >60 mL/min   GFR calc Af Amer >60 >60 mL/min    Comment: (NOTE) The eGFR has been calculated using the CKD EPI equation. This calculation has not been validated in all clinical situations. eGFR's persistently <60 mL/min signify possible Chronic Kidney Disease.    Anion gap 13 5 - 15  CBC WITH DIFFERENTIAL     Status: Abnormal   Collection Time: 12/25/16  3:45 PM  Result Value Ref Range   WBC 9.8 4.0 - 10.5 K/uL   RBC 4.46 4.22 - 5.81 MIL/uL   Hemoglobin 14.5 13.0 - 17.0 g/dL   HCT 43.0 39.0 - 52.0 %   MCV 96.4 78.0 - 100.0 fL   MCH 32.5 26.0 - 34.0 pg   MCHC 33.7 30.0 - 36.0 g/dL   RDW 13.4 11.5 - 15.5 %   Platelets 154 150 - 400 K/uL   Neutrophils Relative % 80 %   Neutro Abs 7.8 (H) 1.7 - 7.7 K/uL   Lymphocytes Relative 7 %   Lymphs Abs 0.7 0.7 - 4.0 K/uL   Monocytes Relative 13 %   Monocytes Absolute 1.3 (H) 0.1 - 1.0 K/uL   Eosinophils Relative 0 %   Eosinophils Absolute 0.0 0.0 - 0.7 K/uL   Basophils Relative 0 %   Basophils Absolute 0.0 0.0 - 0.1 K/uL  I-Stat CG4 Lactic Acid, ED  (not at  [facility])     Status: None   Collection Time: 12/25/16  4:13 PM  Result Value Ref Range   Lactic Acid, Venous 1.29 0.5 - 1.9 mmol/L  Influenza panel by PCR (type A & B)     Status: None   Collection Time: 12/25/16  4:55 PM  Result Value Ref Range   Influenza A By PCR NEGATIVE NEGATIVE   Influenza B By PCR NEGATIVE NEGATIVE    Comment: (NOTE) The Xpert Xpress Flu assay is intended as an aid in the diagnosis of  influenza and should not be used as a sole basis for treatment.  This  assay is FDA approved for nasopharyngeal swab specimens only. Nasal  washings and aspirates are unacceptable for Xpert Xpress Flu testing.   Urinalysis, Routine w reflex microscopic     Status: Abnormal   Collection Time: 12/25/16  7:57 PM  Result Value Ref Range   Color, Urine YELLOW YELLOW   APPearance CLEAR CLEAR   Specific Gravity, Urine >1.046 (H) 1.005 - 1.030   pH 5.0 5.0 - 8.0   Glucose, UA  NEGATIVE NEGATIVE mg/dL   Hgb urine dipstick NEGATIVE NEGATIVE   Bilirubin Urine NEGATIVE NEGATIVE   Ketones, ur 5 (A) NEGATIVE mg/dL   Protein, ur NEGATIVE NEGATIVE  mg/dL   Nitrite NEGATIVE NEGATIVE   Leukocytes, UA NEGATIVE NEGATIVE   Ct Soft Tissue Neck W Contrast  Result Date: 12/25/2016 CLINICAL DATA:  Submental firmness with hoarse voice and difficulty handling secretions. Dysphagia. Recent dental procedure. EXAM: CT NECK WITH CONTRAST TECHNIQUE: Multidetector CT imaging of the neck was performed using the standard protocol following the bolus administration of intravenous contrast. CONTRAST:  79m ISOVUE-300 IOPAMIDOL (ISOVUE-300) INJECTION 61% COMPARISON:  None. FINDINGS: Pharynx and larynx: There is a 28 x 10 x 13 mm rim enhancing fluid collection along the lingual surface of the left mandible. A small focus of gas is present in this collection. There is evidence of recent extraction of a left mandibular molar with osseous lucency extending posteriorly through the posterior lingual cortex. The submandibular collection results in slight mass effect on the adjacent left floor of mouth. This may communicate with a fluid collection just left of midline in the submental space measuring 21 x 13 x 14 mm, and this second collection may communicate with a third smaller fluid collection to the right of midline more inferiorly in the submental space measuring 20 x 8 x 7 mm. Bilateral palatine tonsil calcifications are noted. The larynx is unremarkable. Salivary glands: No primary abnormality of the submandibular or parotid glands is identified. There is inflammatory stranding in the submandibular space bilaterally as well as in the subcutaneous tissues of the lower face and upper neck bilaterally with thickening of the platysma muscles. Thyroid: Unremarkable. Lymph nodes: Increased number of subcentimeter short axis lymph nodes bilaterally in the neck involving levels I -IV, likely reactive. Vascular: Major  vascular structures of the neck appear patent. Limited intracranial: Unremarkable. Visualized orbits: Unremarkable. Mastoids and visualized paranasal sinuses: Minimal mucosal thickening in the paranasal sinuses. Clear mastoid air cells. Skeleton: Mild lower cervical spondylosis. Upper chest: Grossly clear lung apices. subcentimeter superior mediastinal lymph nodes. Other: None. IMPRESSION: 1. Left submandibular and submental space abscesses in the setting of recent left mandibular molar extraction. 2. Numerous small lymph nodes throughout the neck and superior mediastinum, likely reactive. Electronically Signed   By: ALogan BoresM.D.   On: 12/25/2016 18:09    Review of Systems  Constitutional: Positive for fever.  HENT: Positive for sore throat.   Musculoskeletal: Positive for neck pain.  All other systems reviewed and are negative.   Blood pressure 132/83, pulse 78, temperature (S) 101.6 F (38.7 C), temperature source Oral, resp. rate 15, SpO2 97 %. Physical Exam  Constitutional: He is oriented to person, place, and time. He appears well-developed and well-nourished. No distress.  HENT:  Head: Normocephalic and atraumatic.  Right Ear: External ear normal.  Left Ear: External ear normal.  Nose: Nose normal.  Mouth/Throat: Oropharynx is clear and moist.  Left floor of mouth firm along lingual surface of mandible.  Eyes: Conjunctivae and EOM are normal. Pupils are equal, round, and reactive to light.  Neck: Normal range of motion. Neck supple.  Marked submental firm edema with tenderness.  Cardiovascular: Normal rate.   Respiratory: Effort normal.  Musculoskeletal: Normal range of motion.  Neurological: He is alert and oriented to person, place, and time. No cranial nerve deficit.  Skin: Skin is warm and dry.  Psychiatric: He has a normal mood and affect. His behavior is normal. Judgment and thought content normal.     Assessment/Plan Submental abscess I personally reviewed his neck  CT demonstrating a rim-enhancing fluid collection in the submental space and along the lingual surface of the left mandible.  I discussed the situation with the patient.  Infection started with the abscessed molar and extraction was necessary.  However, abscess has developed in the soft tissues and needs further drainage.  I recommended incision and drainage and will approach through a submental incision.  Risks, benefits, and alternatives were discussed and the patient expressed understanding and agreement.  He will be admitted after surgery for at least two days for IV antibiotic therapy with Penrose drain in place.  Melida Quitter, MD 12/25/2016, 8:59 PM

## 2016-12-25 NOTE — ED Provider Notes (Signed)
I saw and evaluated the patient, reviewed the resident's note and I agree with the findings and plan with the following exceptions.    71 year old male with history of diabetes here with a couple days progressively worsening neck pain difficult swallowing difficulty her secretions after a dental abscess with extraction on Thursday.  On exam patient is febrile has significant swelling and induration erythema or warmth to the submandibular area. Does not have significant firmness under his tongue but seems a little more firm than normal. No proptosis of tongue. Does have a hoarse voice.  Concern for possible Ludwig versus sialadenitis versus just other dental infection. We'll start on IV clindamycin and CT scan to further evaluate low threshold for admission for T IV antibiotics and observation secondary to the worsening symptoms and possible airway involvement.    Marily MemosJason Jelisa Creswell, MD 12/25/16 [id number]

## 2016-12-25 NOTE — Brief Op Note (Signed)
12/25/2016  10:04 PM  PATIENT:  [name] DaughtersWilliam [name]  71 y.o. male  PRE-OPERATIVE DIAGNOSIS:  abscess submental  POST-OPERATIVE DIAGNOSIS:  abscess submental  PROCEDURE:  Procedure(s): INCISION AND DRAINAGE SUBMENTAL ABSCESS (N/A)  SURGEON:  Surgeon(s) and Role:    * Christia Readingwight Lakeya Mulka, MD - Primary  PHYSICIAN ASSISTANT:   ASSISTANTS: none   ANESTHESIA:   general  EBL:  Total I/O In: 700 [I.V.:700] Out: 10 [Blood:10]  BLOOD ADMINISTERED:none  DRAINS: Penrose drain in the submental neck   LOCAL MEDICATIONS USED:  MARCAINE     SPECIMEN:  No Specimen  DISPOSITION OF SPECIMEN:  N/A  COUNTS:  YES  TOURNIQUET:  * No tourniquets in log *  DICTATION: .Other Dictation: Dictation Number [id number]  PLAN OF CARE: Admit to inpatient   PATIENT DISPOSITION:  PACU - hemodynamically stable.   Delay start of Pharmacological VTE agent (>24hrs) due to surgical blood loss or risk of bleeding: no

## 2016-12-26 ENCOUNTER — Encounter (HOSPITAL_COMMUNITY): Payer: Self-pay | Admitting: Otolaryngology

## 2016-12-26 LAB — GLUCOSE, CAPILLARY
GLUCOSE-CAPILLARY: 120 mg/dL — AB (ref 65–99)
GLUCOSE-CAPILLARY: 137 mg/dL — AB (ref 65–99)
GLUCOSE-CAPILLARY: 155 mg/dL — AB (ref 65–99)
GLUCOSE-CAPILLARY: 155 mg/dL — AB (ref 65–99)
Glucose-Capillary: 161 mg/dL — ABNORMAL HIGH (ref 65–99)

## 2016-12-26 NOTE — Progress Notes (Signed)
   Subjective:    Patient ID: Larna DaughtersWilliam Borelli, male    DOB: 1945/12/13, 71 y.o.   MRN: [id number]  HPI Feeling good.  Neck remains swollen.    Review of Systems     Objective:   Physical Exam T max 101.6.  Alert, NAD. Submental firmness and tenderness remains, Penrose drain in place with scant drainage.      Assessment & Plan:  Submental abscess s/p I&D. Doing well.  Continue IV antibiotic and Penrose drain.

## 2016-12-26 NOTE — Op Note (Signed)
NAME:  Purnell ShoemakerCUTTS, Omid               ACCOUNT NO.:  [id number]  MEDICAL RECORD NO.:  [id number]  LOCATION:  [id number]                        FACILITY:  [facility]  PHYSICIAN:  Antony Contraswight D Shonta Phillis, MD     DATE OF BIRTH:  1946-05-13  DATE OF PROCEDURE:  12/25/2016 DATE OF DISCHARGE:                              OPERATIVE REPORT   PREOPERATIVE DIAGNOSIS:  Submental abscess.  POSTOP DIAGNOSIS:  Submental abscess.  PROCEDURE:  Incision and drainage of submental abscess.  SURGEON:  Antony Contraswight D Taylormarie Register, MD.  ANESTHESIA:  General endotracheal anesthesia.  COMPLICATIONS:  None.  INDICATION:  The patient is a 71 year old male who had a left lower molar extraction 4 days ago because of an abscessed tooth, but over the last 2 days, has had increasing swelling under the chin that is tender and also some fever and malaise.  He came to the emergency department where CT scan demonstrated a rim enhancing fluid collection in the submental space and extending up along the lingual surface of the mandible on the left.  He presents to the operating room for surgical management.  FINDINGS:  Dissecting into the abscess, a pocket of dark yellow pus was encountered that had a bad odor.  Dissection was able to be extended up along the lingual surface of the mandible.  Cultures were taken.  DESCRIPTION OF PROCEDURE:  The patient was identified in the holding room.  Informed consent having been obtained including discussion of risks, benefits, alternatives, the patient was brought to the operative suite and placed on the operating room table in supine position. Anesthesia was induced and the patient was intubated by anesthesia team using a glide scope without difficulty.  The patient had received intravenous clindamycin in the emergency department.  The eyes were taped closed and the neck was prepped and draped in sterile fashion. Incision was marked with a marking pen and injected with 0.5% Marcaine with 1:200000  epinephrine.  Incision was made with a 15 blade scalpel through the skin in the submental crease and extended through subcutaneous fat using Bovie electrocautery.  Scissors were then used to bluntly dissect into the abscess cavity in the midline and pus was immediately encountered.  Culture swabs were placed down into the abscess cavity and sent to microbiology.  The scissors were then used to bluntly dissect up into the floor of mouth laterally with finger palpation within the mouth guiding the dissection into the firm area along the lingual surface of the mandible.  Further dissection was then performed bluntly within the abscess cavity to break up loculations.  A red rubber catheter was placed into the depth of the cavity and the cavity was copiously irrigated with saline.  After this was completed, a quarter-inch Penrose drain was placed in the depth of the cavity and secured to the skin using 2-0 nylon suture.  Drapes were removed and the patient was cleaned off.  A fluff dressing placed around the neck.  He was then returned to Anesthesia for wake-up, was extubated in the recovery room in stable condition.     Antony Contraswight D Shahil Speegle, MD     DDB/MEDQ  D:  12/25/2016  T:  12/26/2016  Job:  [id number] 

## 2016-12-27 LAB — GLUCOSE, CAPILLARY
GLUCOSE-CAPILLARY: 119 mg/dL — AB (ref 65–99)
GLUCOSE-CAPILLARY: 122 mg/dL — AB (ref 65–99)
Glucose-Capillary: 89 mg/dL (ref 65–99)
Glucose-Capillary: 161 mg/dL — ABNORMAL HIGH (ref 65–99)

## 2016-12-27 LAB — URINE CULTURE: Culture: NO GROWTH

## 2016-12-27 MED ORDER — CIPROFLOXACIN IN D5W 400 MG/200ML IV SOLN
400.0000 mg | Freq: Two times a day (BID) | INTRAVENOUS | Status: DC
  Administered 2016-12-27 – 2016-12-29 (×5): 400 mg via INTRAVENOUS
  Filled 2016-12-27 (×7): qty 200

## 2016-12-27 NOTE — Progress Notes (Signed)
   Subjective:    Patient ID: [name], male    DOB: [birth year], 71 y.o.   MRN: [id number]  HPI He feels well.  The neck does not seem to be improving yet.  There has not been much drainage.    Review of Systems     Objective:   Physical Exam Alert, NAD. Penrose drain in place with scant drainage.  Submental space and left floor of mouth remain firmly edematous with tenderness.      Assessment & Plan:  Submental abscess s/p I&D Soft tissue edema has not improved much.  Gram stain demonstrates gram positive and gram negative bacteria.  I will add Cipro to the clindamycin.  We agreed to continue IV antibiotic therapy until we see certain improvement in swelling.  Will leave Penrose drain in place.

## 2016-12-28 ENCOUNTER — Encounter (HOSPITAL_COMMUNITY): Payer: Self-pay | Admitting: Otolaryngology

## 2016-12-28 LAB — BASIC METABOLIC PANEL
Anion gap: 6 (ref 5–15)
BUN: 15 mg/dL (ref 6–20)
CHLORIDE: 106 mmol/L (ref 101–111)
CO2: 27 mmol/L (ref 22–32)
CREATININE: 1.06 mg/dL (ref 0.61–1.24)
Calcium: 8.4 mg/dL — ABNORMAL LOW (ref 8.9–10.3)
Glucose, Bld: 127 mg/dL — ABNORMAL HIGH (ref 65–99)
POTASSIUM: 3.8 mmol/L (ref 3.5–5.1)
SODIUM: 139 mmol/L (ref 135–145)

## 2016-12-28 LAB — GLUCOSE, CAPILLARY
GLUCOSE-CAPILLARY: 107 mg/dL — AB (ref 65–99)
GLUCOSE-CAPILLARY: 109 mg/dL — AB (ref 65–99)
Glucose-Capillary: 122 mg/dL — ABNORMAL HIGH (ref 65–99)
Glucose-Capillary: 210 mg/dL — ABNORMAL HIGH (ref 65–99)

## 2016-12-28 NOTE — Anesthesia Postprocedure Evaluation (Addendum)
Anesthesia Post Note  Patient: [name]  Procedure(s) Performed: Procedure(s) (LRB): INCISION AND DRAINAGE SUBMENTAL ABSCESS (N/A)  Patient location during evaluation: PACU Anesthesia Type: General Level of consciousness: awake and alert Pain management: pain level controlled Vital Signs Assessment: post-procedure vital signs reviewed and stable Respiratory status: spontaneous breathing, nonlabored ventilation, respiratory function stable and patient connected to nasal cannula oxygen Cardiovascular status: blood pressure returned to baseline and stable Postop Assessment: no signs of nausea or vomiting Anesthetic complications: no       Last Vitals:  Vitals:   12/27/16 2138 12/28/16 0458  BP: 120/69 116/69  Pulse: 72 60  Resp: 18 18  Temp: 37.3 C 36.8 C    Last Pain:  Vitals:   12/28/16 1000  TempSrc:   PainSc: 0-No pain                 Earlyne Feeser

## 2016-12-28 NOTE — Progress Notes (Signed)
   Subjective:    Patient ID: [name], male    DOB: [birth year], 71 y.o.   MRN: [id number]  HPI He feels that the swelling inside the mouth may be improved a bit.  No new complaints.    Review of Systems     Objective:   Physical Exam AF VSS Alert, NAD Submental edema remains prominent, slightly less firm and less tender. Floor of mouth with some firmness on the left.     Assessment & Plan:  Submental abscess s/p I&D The area may be starting to show signs of improvement.  Added Cipro yesterday based on gram stain.  Culture results still pending.  Continue IV clindamycin and Cipro and Penrose drain.  Possible discharge tomorrow.

## 2016-12-28 NOTE — Transfer of Care (Signed)
Immediate Anesthesia Transfer of Care Note  Patient: [name] DaughtersWilliam [name]  Procedure(s) Performed: Procedure(s): INCISION AND DRAINAGE SUBMENTAL ABSCESS (N/A)  Patient Location: PACU  Anesthesia Type:General  Level of Consciousness: awake  Airway & Oxygen Therapy: Patient Spontanous Breathing and Patient connected to nasal cannula oxygen  Post-op Assessment: Report given to RN and Post -op Vital signs reviewed and stable  Post vital signs: Reviewed and stable  Last Vitals:  Vitals:   12/27/16 2138 12/28/16 0458  BP: 120/69 116/69  Pulse: 72 60  Resp: 18 18  Temp: 37.3 C 36.8 C    Last Pain:  Vitals:   12/28/16 1000  TempSrc:   PainSc: 0-No pain         Complications: No apparent anesthesia complications

## 2016-12-29 LAB — GLUCOSE, CAPILLARY
Glucose-Capillary: 126 mg/dL — ABNORMAL HIGH (ref 65–99)
Glucose-Capillary: 107 mg/dL — ABNORMAL HIGH (ref 65–99)

## 2016-12-29 MED ORDER — CIPROFLOXACIN HCL 500 MG PO TABS: 500.0000 mg | ORAL_TABLET | Freq: Two times a day (BID) | ORAL | 0 refills | Status: DC

## 2016-12-29 MED ORDER — CLINDAMYCIN HCL 300 MG PO CAPS: 300.0000 mg | ORAL_CAPSULE | Freq: Four times a day (QID) | ORAL | 0 refills | Status: DC

## 2016-12-29 NOTE — Discharge Summary (Signed)
Physician Discharge Summary  Patient ID: [name] MRN: [id number] DOB/AGE: 1946-03-17 71 y.o.  Admit date: 12/25/2016 Discharge date: 12/29/2016  Admission Diagnoses: Submental abscess  Discharge Diagnoses:  Active Problems:   Submental abscess   Discharged Condition: good  Hospital Course: 71 year old male presented to ER with submental swelling and pain about five days following a dental extraction done for infection.  He was taken to the operating room for incision and drainage and was admitted for IV antibiotics.  Clindamycin was used at first but Cipro was later added when the gram stain showed various organisms.  He has not had much drainage and swelling started to improve by the day of discharge.  The drain was removed.  Consults: None  Significant Diagnostic Studies: microbiology: wound culture: positive  Treatments: surgery: Incision and drainage submental abscess  Discharge Exam: Blood pressure 132/86, pulse 70, temperature 98.9 F (37.2 C), temperature source Oral, resp. rate 18, SpO2 98 %. General appearance: alert, cooperative and no distress Neck: submental and left floor of mouth with continued edema, somewhat less, Penrose drain removed.  Disposition: 81-Discharged to home/self-care with a planned acute care hospital inpt readmission  Discharge Instructions    Diet - low sodium heart healthy    Complete by:  As directed    Discharge instructions    Complete by:  As directed    Keep head elevated.  Apply antibiotic ointment to wound twice daily.   Increase activity slowly    Complete by:  As directed      Allergies as of 12/29/2016   No Known Allergies     Medication List    TAKE these medications   allopurinol 100 MG tablet Commonly known as:  ZYLOPRIM Take 100 mg by mouth daily.   aspirin 325 MG EC tablet Take 325 mg by mouth daily.   atorvastatin 20 MG tablet Commonly known as:  LIPITOR Take 20 mg by mouth daily with supper.    ciprofloxacin 500 MG tablet Commonly known as:  CIPRO Take 1 tablet (500 mg total) by mouth 2 (two) times daily.   clindamycin 300 MG capsule Commonly known as:  CLEOCIN Take 1 capsule (300 mg total) by mouth 4 (four) times daily.   HYDROcodone-acetaminophen 5-325 MG tablet Commonly known as:  NORCO/VICODIN Take 1 tablet by mouth every 8 (eight) hours as needed (dental pain).   ibuprofen 800 MG tablet Commonly known as:  ADVIL,MOTRIN Take 800 mg by mouth every 6 (six) hours as needed (pain).   ibuprofen 200 MG tablet Commonly known as:  ADVIL,MOTRIN Take 200 mg by mouth at bedtime as needed (excercise pain).   metFORMIN 500 MG 24 hr tablet Commonly known as:  GLUCOPHAGE-XR Take 500 mg by mouth 2 (two) times daily with a meal.      Follow-up Information    Rashon Westrup, MD. Schedule an appointment as soon as possible for a visit in 1 week(s).   Specialty:  Otolaryngology Contact information: [address] [place] [address] [phone]           Signed: Christia ReadingBATES, Adelina Collard 12/29/2016, 1:57 PM

## 2016-12-29 NOTE — Care Management Important Message (Signed)
Important Message  Patient Details  Name: [name] MRN: [id number] Date of Birth: 01/05/1946   Medicare Important Message Given:  Yes    [name] 12/29/2016, 1:22 PM

## 2016-12-30 LAB — CULTURE, BLOOD (ROUTINE X 2)
CULTURE: NO GROWTH
Culture: NO GROWTH

## 2016-12-31 LAB — AEROBIC/ANAEROBIC CULTURE (SURGICAL/DEEP WOUND): CULTURE: NORMAL

## 2016-12-31 LAB — AEROBIC/ANAEROBIC CULTURE W GRAM STAIN (SURGICAL/DEEP WOUND)

## 2017-05-14 NOTE — Addendum Note (Signed)
Addendum  created 05/14/17 1003 by Bray Vickerman, MD   Sign clinical note    

## 2017-08-01 ENCOUNTER — Other Ambulatory Visit: Payer: Self-pay | Admitting: Gastroenterology

## 2017-08-20 ENCOUNTER — Other Ambulatory Visit: Payer: Self-pay | Admitting: Gastroenterology

## 2017-08-22 ENCOUNTER — Encounter (HOSPITAL_COMMUNITY): Payer: Self-pay | Admitting: *Deleted

## 2017-08-22 NOTE — Progress Notes (Signed)
Pt denies SOB, chest pain, and being under the care of a cardiologist. Pt stated that a stress test and echo was performed " around the time I had the heart surgery." Pt made aware to stop taking vitamins, fish oil, herbal medications and NSAIDs ie: Ibuprofen, Advil, Naproxen (Aleve), Motrin, BC and Goody Powder. Pt stated that he was instructed to hold Metformin today and tomorrow. Pt made aware to check BG every 2 hours prior to arrival to hospital on DOS. Pt made aware to treat a BG < 70 with  4 ounces of apple or cranberry juice, wait 15 minutes after intervention to recheck BG, if BG remains < 70, call Short Stay unit to speak with a nurse. Pt verbalized understanding of all pre-op instructions.

## 2017-08-22 NOTE — Progress Notes (Signed)
Anesthesia asked to review pt history. 

## 2017-08-22 NOTE — Progress Notes (Signed)
Anesthesia Chart Review:  Pt is a same day work up.   Pt is a 71 year old male scheduled for colonoscopy, fecal transplant on 08/23/2017 with Willis ModenaWilliam Outlaw, MD  PCP is Daisy Floroharles Alan Ross, MD  PMH includes:  CAD (s/p CABG 04/19/12), DM.  Never smoker. S/p incision and drainage submental abscess 12/25/16.   Medications include: ASA 325 mg, Lipitor, metformin  Labs per endo protocol.   EKG 12/25/16: NSR. Nonspecific T wave abnormality  PAT RN contacted pt by telephone.  He denies active CV sx.  He has not followed up with cardiology since August 2013, approximately 3 months s/p CABG (found in media tab).    Attempting to get records from PCP's office to see if PCP is monitoring CAD.   Pt will need further assessment DOS by assigned anesthesiologist.  If no concerning CV sx, I anticipate pt can proceed as scheduled.   Rica Mastngela Eisley Barber, FNP-BC [facility] Phone: [phone] 08/22/2017 4:48 PM

## 2017-08-23 ENCOUNTER — Encounter (HOSPITAL_COMMUNITY): Payer: Self-pay

## 2017-08-23 ENCOUNTER — Ambulatory Visit (HOSPITAL_COMMUNITY): Payer: Medicare Other | Admitting: Emergency Medicine

## 2017-08-23 ENCOUNTER — Encounter (HOSPITAL_COMMUNITY)
Admission: RE | Disposition: A | Discharge status: Home / Self Care | Payer: Self-pay | Source: Ambulatory Visit | Attending: Gastroenterology

## 2017-08-23 ENCOUNTER — Ambulatory Visit (HOSPITAL_COMMUNITY)
Admission: RE | Admit: 2017-08-23 | Discharge: 2017-08-23 | Disposition: A | Discharge status: Home / Self Care | Payer: Medicare Other | Source: Ambulatory Visit | Attending: Gastroenterology | Admitting: Gastroenterology

## 2017-08-23 DIAGNOSIS — E119 Type 2 diabetes mellitus without complications: Secondary | ICD-10-CM | POA: Insufficient documentation

## 2017-08-23 DIAGNOSIS — Z79899 Other long term (current) drug therapy: Secondary | ICD-10-CM | POA: Diagnosis not present

## 2017-08-23 DIAGNOSIS — A0471 Enterocolitis due to Clostridium difficile, recurrent: Secondary | ICD-10-CM | POA: Diagnosis not present

## 2017-08-23 DIAGNOSIS — I2510 Atherosclerotic heart disease of native coronary artery without angina pectoris: Secondary | ICD-10-CM | POA: Insufficient documentation

## 2017-08-23 DIAGNOSIS — Z951 Presence of aortocoronary bypass graft: Secondary | ICD-10-CM | POA: Insufficient documentation

## 2017-08-23 DIAGNOSIS — Z7982 Long term (current) use of aspirin: Secondary | ICD-10-CM | POA: Insufficient documentation

## 2017-08-23 HISTORY — PX: COLONOSCOPY WITH PROPOFOL: SHX5780

## 2017-08-23 HISTORY — DX: Other bacterial infections of unspecified site: A49.8

## 2017-08-23 HISTORY — PX: FECAL TRANSPLANT: SHX6383

## 2017-08-23 LAB — GLUCOSE, CAPILLARY: GLUCOSE-CAPILLARY: 101 mg/dL — AB (ref 65–99)

## 2017-08-23 SURGERY — COLONOSCOPY WITH PROPOFOL
Anesthesia: Monitor Anesthesia Care

## 2017-08-23 MED ORDER — SODIUM CHLORIDE 0.9 % IV SOLN: INTRAVENOUS | Status: DC

## 2017-08-23 MED ORDER — LOPERAMIDE HCL 2 MG PO CAPS
4.0000 mg | ORAL_CAPSULE | Freq: Once | ORAL | Status: AC
  Administered 2017-08-23: 4 mg via ORAL
  Filled 2017-08-23: qty 2

## 2017-08-23 MED ORDER — LACTATED RINGERS IV SOLN
INTRAVENOUS | Status: DC
  Administered 2017-08-23: 12:00:00 via INTRAVENOUS

## 2017-08-23 MED ORDER — PROPOFOL 10 MG/ML IV BOLUS
INTRAVENOUS | Status: DC | PRN
  Administered 2017-08-23: 40 mg via INTRAVENOUS
  Administered 2017-08-23: 80 mg via INTRAVENOUS
  Administered 2017-08-23: 20 mg via INTRAVENOUS
  Administered 2017-08-23: 30 mg via INTRAVENOUS
  Administered 2017-08-23: 60 mg via INTRAVENOUS

## 2017-08-23 MED ORDER — MEPERIDINE HCL 100 MG/ML IJ SOLN: 6.2500 mg | INTRAMUSCULAR | Status: DC | PRN

## 2017-08-23 MED ORDER — LIDOCAINE 2% (20 MG/ML) 5 ML SYRINGE
INTRAMUSCULAR | Status: DC | PRN
  Administered 2017-08-23: 50 mg via INTRAVENOUS

## 2017-08-23 MED ORDER — FENTANYL CITRATE (PF) 100 MCG/2ML IJ SOLN: 25.0000 ug | INTRAMUSCULAR | Status: DC | PRN

## 2017-08-23 MED ORDER — ONDANSETRON HCL 4 MG/2ML IJ SOLN: 4.0000 mg | Freq: Once | INTRAMUSCULAR | Status: DC | PRN

## 2017-08-23 MED ORDER — LACTATED RINGERS IV SOLN
INTRAVENOUS | Status: DC | PRN
  Administered 2017-08-23: 12:00:00 via INTRAVENOUS

## 2017-08-23 SURGICAL SUPPLY — 22 items
ELECT REM PT RETURN 9FT ADLT (ELECTROSURGICAL)
ELECTRODE REM PT RTRN 9FT ADLT (ELECTROSURGICAL) IMPLANT
FCP BXJMBJMB 240X2.8X (CUTTING FORCEPS)
FLOOR PAD 36X40 (MISCELLANEOUS) ×3
FORCEPS BIOP RAD 4 LRG CAP 4 (CUTTING FORCEPS) IMPLANT
FORCEPS BIOP RJ4 240 W/NDL (CUTTING FORCEPS)
FORCEPS BXJMBJMB 240X2.8X (CUTTING FORCEPS) IMPLANT
INJECTOR/SNARE I SNARE (MISCELLANEOUS) IMPLANT
LUBRICANT JELLY 4.5OZ STERILE (MISCELLANEOUS) IMPLANT
MANIFOLD NEPTUNE II (INSTRUMENTS) IMPLANT
NEEDLE SCLEROTHERAPY 25GX240 (NEEDLE) IMPLANT
PAD FLOOR 36X40 (MISCELLANEOUS) ×1 IMPLANT
PREP MICROBIOTA FECAL (Tissue) ×3 IMPLANT
PROBE APC STR FIRE (PROBE) IMPLANT
PROBE INJECTION GOLD (MISCELLANEOUS)
PROBE INJECTION GOLD 7FR (MISCELLANEOUS) IMPLANT
SNARE ROTATE MED OVAL 20MM (MISCELLANEOUS) IMPLANT
SYR 50ML LL SCALE MARK (SYRINGE) IMPLANT
TRAP SPECIMEN MUCOUS 40CC (MISCELLANEOUS) IMPLANT
TUBING ENDO SMARTCAP PENTAX (MISCELLANEOUS) IMPLANT
TUBING IRRIGATION ENDOGATOR (MISCELLANEOUS) ×3 IMPLANT
WATER STERILE IRR 1000ML POUR (IV SOLUTION) IMPLANT

## 2017-08-23 NOTE — Op Note (Signed)
[facility] Patient Name: [name] DaughtersWilliam Vertz Procedure Date : 08/23/2017 MRN: [id number] Attending MD: Willis ModenaWilliam Chike Farrington , MD Date of Birth: 05-05-1946 CSN: [id number] Age: [age] Admit Type: Outpatient Procedure:                Colonoscopy Indications:              This is the patient's first colonoscopy, Fecal                            transplant for treatment of recurrent Clostridium                            difficile colitis Providers:                Willis ModenaWilliam Grahm Etsitty, MD, Dwain SarnaPatricia Ford, RN, Zoila ShutterGary Bryant,                            Technician Referring MD:             Charlott RakesVincent Schooler, MD Medicines:                Monitored Anesthesia Care Complications:            No immediate complications. Estimated Blood Loss:     Estimated blood loss: none. Procedure:                Pre-Anesthesia Assessment:                           - Prior to the procedure, a History and Physical                            was performed, and patient medications and                            allergies were reviewed. The patient's tolerance of                            previous anesthesia was also reviewed. The risks                            and benefits of the procedure and the sedation                            options and risks were discussed with the patient.                            All questions were answered, and informed consent                            was obtained. Prior Anticoagulants: The patient has                            taken aspirin. ASA Grade Assessment: III - A                            patient  with severe systemic disease. After                            reviewing the risks and benefits, the patient was                            deemed in satisfactory condition to undergo the                            procedure.                           After obtaining informed consent, the colonoscope                            was passed under direct vision. Throughout the                             procedure, the patient's blood pressure, pulse, and                            oxygen saturations were monitored continuously. The                            EC-[id number] ([id number]) scope was introduced through                            the anus and advanced to the the terminal ileum,                            with identification of the appendiceal orifice and                            IC valve. The terminal ileum, the ileocecal valve                            and the appendiceal orifice were photographed. The                            colonoscopy was performed without difficulty. The                            patient tolerated the procedure well. The quality                            of the bowel preparation was inadequate. Scope In: 12:29:35 PM Scope Out: 12:40:59 PM Scope Withdrawal Time: 0 hours 2 minutes 10 seconds  Total Procedure Duration: 0 hours 11 minutes 24 seconds  Findings:      The perianal and digital rectal examinations were normal.      A diffuse pseudomembrane was found in the entire colon.      The terminal ileum appeared normal. Fecal Microbiota Transplant       (Bacteriotherapy): Donor stool was prepared by a third party (purchased)       using  saline as per protocol. Approximately 300 mL of the emulsified       donor stool was instilled in the distal ileum and in the terminal ileum.       A detailed colonoscopic exam could not be performed upon scope       withdrawal secondary to limited visibility from the instilled stool as       well as due to extensive pseudomembranes. Estimated blood loss: none. Impression:               - Preparation of the colon was inadequate.                           - Pseudomembranous enterocolitis.                           - The examined portion of the ileum was normal.                           - Fecal Microbiota Transplant (Bacteriotherapy)                            performed in the distal ileum and in the terminal                             ileum. Moderate Sedation:      None Recommendation:           - Patient has a contact number available for                            emergencies. The signs and symptoms of potential                            delayed complications were discussed with the                            patient. Return to normal activities tomorrow.                            Written discharge instructions were provided to the                            patient.                           - Discharge patient to home (ambulatory).                           - Soft diet today.                           - Continue present medications.                           - Repeat colonoscopy in 4 months for screening  purposes, after he recovers from current c. diff                            colitis illness.                           - Return to GI clinic after studies are complete.                           - Return to referring physician as previously                            scheduled. Procedure Code(s):        --- Professional ---                           [phone], Colonoscopy, flexible; diagnostic, including                            collection of specimen(s) by brushing or washing,                            when performed (separate procedure)                           [phone], Preparation of fecal microbiota for                            instillation, including assessment of donor specimen Diagnosis Code(s):        --- Professional ---                           A04.7, Enterocolitis due to Clostridium difficile CPT copyright 2016 [employer]. All rights reserved. The codes documented in this report are preliminary and upon coder review may  be revised to meet current compliance requirements. Willis Modena, MD 08/23/2017 12:53:13 PM This report has been signed electronically. Number of Addenda: 0

## 2017-08-23 NOTE — H&P (Signed)
Patient interval history reviewed.  Patient examined again.  There has been no change from documented H/P dated 07/31/17 (scanned into chart from our office) except as documented above.  Assessment:  1.  Recurrent C. Diff colitis.  Needs fecal transplant.  Plan:  1.  Colonoscopy with fecal transplant. 2.  Risks (bleeding, infection, bowel perforation that could require surgery, sedation-related changes in cardiopulmonary systems), benefits (identification and possible treatment of source of symptoms, exclusion of certain causes of symptoms), and alternatives (watchful waiting, radiographic imaging studies, empiric medical treatment) of colonoscopy were explained to patient/family in detail and patient wishes to proceed.

## 2017-08-23 NOTE — Transfer of Care (Signed)
Immediate Anesthesia Transfer of Care Note  Patient: [name]  Procedure(s) Performed: Procedure(s): COLONOSCOPY WITH PROPOFOL (N/A) FECAL TRANSPLANT (N/A)  Patient Location: Endoscopy Unit  Anesthesia Type:MAC  Level of Consciousness: awake and patient cooperative  Airway & Oxygen Therapy: Patient Spontanous Breathing and Patient connected to nasal cannula oxygen  Post-op Assessment: Report given to RN and Post -op Vital signs reviewed and stable  Post vital signs: Reviewed and stable  Last Vitals:  Vitals:   08/23/17 1117  BP: 120/70  Pulse: 70  Resp: 13  Temp: 36.8 C  SpO2: 94%    Last Pain:  Vitals:   08/23/17 1117  TempSrc: Oral         Complications: No apparent anesthesia complications

## 2017-08-23 NOTE — Anesthesia Preprocedure Evaluation (Signed)
Anesthesia Evaluation  Patient identified by MRN, date of birth, ID band Patient awake    Reviewed: Allergy & Precautions, NPO status , Patient's Chart, lab work & pertinent test results, reviewed documented beta blocker date and time   History of Anesthesia Complications Negative for: history of anesthetic complications  Airway   TM Distance: >3 FB Neck ROM: Full Positive for:  Tracheal deviation Mouth opening: Limited Mouth Opening  Dental  (+) Partial Upper, Partial Lower, Dental Advisory Given   Pulmonary neg pulmonary ROS,    breath sounds clear to auscultation       Cardiovascular + angina + CAD and + CABG   Rhythm:Regular     Neuro/Psych negative neurological ROS  negative psych ROS   GI/Hepatic negative GI ROS, Neg liver ROS,   Endo/Other  diabetes  Renal/GU negative Renal ROS     Musculoskeletal   Abdominal   Peds  Hematology   Anesthesia Other Findings   Reproductive/Obstetrics                                                              Anesthesia Evaluation  Patient identified by MRN, date of birth, ID band Patient awake    Reviewed: Allergy & Precautions, H&P , NPO status , Patient's Chart, lab work & pertinent test results  Airway Mallampati: II TM Distance: >3 FB Neck ROM: Full    Dental no notable dental hx.    Pulmonary pneumonia -, resolved,  breath sounds clear to auscultation  Pulmonary exam normal       Cardiovascular Exercise Tolerance: Good + angina + CAD negative cardio ROS  Rhythm:Regular Rate:Normal  S/P CABG May 2013  ECG and CXR reviewed.   Neuro/Psych negative neurological ROS  negative psych ROS   GI/Hepatic negative GI ROS, Neg liver ROS,   Endo/Other  diabetes, Type 2, Oral Hypoglycemic Agents  Renal/GU negative Renal ROS  negative genitourinary   Musculoskeletal negative musculoskeletal ROS (+)   Abdominal    Peds negative pediatric ROS (+)  Hematology negative hematology ROS (+)   Anesthesia Other Findings   Reproductive/Obstetrics negative OB ROS                           Anesthesia Physical Anesthesia Plan  ASA: III  Anesthesia Plan: General   Post-op Pain Management:    Induction: Intravenous  Airway Management Planned: LMA  Additional Equipment:   Intra-op Plan:   Post-operative Plan: Extubation in OR  Informed Consent: I have reviewed the patients History and Physical, chart, labs and discussed the procedure including the risks, benefits and alternatives for the proposed anesthesia with the patient or authorized representative who has indicated his/her understanding and acceptance.   Dental advisory given  Plan Discussed with: CRNA  Anesthesia Plan Comments:         Anesthesia Quick Evaluation  Anesthesia Physical  Anesthesia Plan  ASA: III and emergent  Anesthesia Plan: MAC   Post-op Pain Management:    Induction: Intravenous  PONV Risk Score and Plan: 2 and Ondansetron, Dexamethasone, Treatment may vary due to age or medical condition and Midazolam  Airway Management Planned: Nasal Cannula, Natural Airway and Mask  Additional Equipment: None  Intra-op Plan:   Post-operative Plan: Extubation in OR  Informed Consent: I have reviewed the patients History and Physical, chart, labs and discussed the procedure including the risks, benefits and alternatives for the proposed anesthesia with the patient or authorized representative who has indicated his/her understanding and acceptance.   Dental advisory given  Plan Discussed with: CRNA, Anesthesiologist and Surgeon  Anesthesia Plan Comments:         Anesthesia Quick Evaluation

## 2017-08-23 NOTE — Anesthesia Procedure Notes (Signed)
Procedure Name: MAC Date/Time: 08/23/2017 12:27 PM Performed by: Orlie Dakin Pre-anesthesia Checklist: Patient identified, Emergency Drugs available, Suction available, Patient being monitored and Timeout performed Patient Re-evaluated:Patient Re-evaluated prior to induction Oxygen Delivery Method: Nasal cannula Preoxygenation: Pre-oxygenation with 100% oxygen

## 2017-08-23 NOTE — Anesthesia Postprocedure Evaluation (Signed)
Anesthesia Post Note  Patient: [name]  Procedure(s) Performed: Procedure(s) (LRB): COLONOSCOPY WITH PROPOFOL (N/A) FECAL TRANSPLANT (N/A)     Patient location during evaluation: PACU Anesthesia Type: MAC Level of consciousness: awake and alert Pain management: pain level controlled Vital Signs Assessment: post-procedure vital signs reviewed and stable Respiratory status: spontaneous breathing, nonlabored ventilation, respiratory function stable and patient connected to nasal cannula oxygen Cardiovascular status: stable and blood pressure returned to baseline Postop Assessment: no apparent nausea or vomiting Anesthetic complications: no    Last Vitals:  Vitals:   08/23/17 1117 08/23/17 1246  BP: 120/70 (!) 103/54  Pulse: 70 63  Resp: 13 16  Temp: 36.8 C 36.7 C  SpO2: 94% 99%    Last Pain:  Vitals:   08/23/17 1246  TempSrc: Oral                 Rayane Gallardo

## 2017-08-23 NOTE — Discharge Instructions (Signed)
Colonoscopy ° °Post procedure instructions: ° °Read the instructions outlined below and refer to this sheet in the next few weeks. These discharge instructions provide you with general information on caring for yourself after you leave the hospital. Your doctor may also give you specific instructions. While your treatment has been planned according to the most current medical practices available, unavoidable complications occasionally occur. If you have any problems or questions after discharge, call Dr. Morris Markham at [facility] ([phone]). ° °HOME CARE INSTRUCTIONS ° °ACTIVITY: °· You may resume your regular activity, but move at a slower pace for the next 24 hours.  °· Take frequent rest periods for the next 24 hours.  °· Walking will help get rid of the air and reduce the bloated feeling in your belly (abdomen).  °· No driving for 24 hours (because of the medicine (anesthesia) used during the test).  °· You may shower.  °· Do not sign any important legal documents or operate any machinery for 24 hours (because of the anesthesia used during the test).  °NUTRITION: °· Drink plenty of fluids.  °· You may resume your normal diet as instructed by your doctor.  °· Begin with a light meal and progress to your normal diet. Heavy or fried foods are harder to digest and may make you feel sick to your stomach (nauseated).  °· Avoid alcoholic beverages for 24 hours or as instructed.  °MEDICATIONS: °· You may resume your normal medications unless your doctor tells you otherwise.  °WHAT TO EXPECT TODAY: °· Some feelings of bloating in the abdomen.  °· Passage of more gas than usual.  °· Spotting of blood in your stool or on the toilet paper.  °IF YOU HAD POLYPS REMOVED DURING THE COLONOSCOPY: °· No aspirin products for 7 days or as instructed.  °· No alcohol for 7 days or as instructed.  °· Eat a soft diet for the next 24 hours.  ° °FINDING OUT THE RESULTS OF YOUR TEST ° °Not all test results are available during your  visit. If your test results are not back during the visit, make an appointment with your caregiver to find out the results. Do not assume everything is normal if you have not heard from your caregiver or the medical facility. It is important for you to follow up on all of your test results.  ° ° ° °SEEK IMMEDIATE MEDICAL CARE IF: ° °· You have more than a spotting of blood in your stool.  °· Your belly is swollen (abdominal distention).  °· You are nauseated or vomiting.  °· You have a fever.  °· You have abdominal pain or discomfort that is severe or gets worse throughout the day.  ° ° °Document Released: 07/11/2004 Document Revised: 08/09/2011 Document Reviewed: 07/09/2008 °[employer]® Patient Information ©2012 [employer]. ° °

## 2017-10-25 ENCOUNTER — Telehealth: Payer: Self-pay | Admitting: Internal Medicine

## 2017-10-25 NOTE — Telephone Encounter (Signed)
Patient is roughly 2 months out from having FMT and reports that he is doing great. He has gained weight back. Mostly has 1 BM per day. Back to exercising and good quality of life

## 2017-11-06 ENCOUNTER — Encounter (HOSPITAL_COMMUNITY): Payer: Self-pay

## 2017-11-06 ENCOUNTER — Encounter (HOSPITAL_COMMUNITY)
Admission: EM | Disposition: A | Discharge status: Home / Self Care | Payer: Self-pay | Source: Home / Self Care | Attending: Emergency Medicine

## 2017-11-06 ENCOUNTER — Emergency Department (HOSPITAL_COMMUNITY): Payer: Medicare Other

## 2017-11-06 ENCOUNTER — Observation Stay (HOSPITAL_COMMUNITY)
Admission: EM | Admit: 2017-11-06 | Discharge: 2017-11-07 | Disposition: A | Discharge status: Home / Self Care | Payer: Medicare Other | Attending: Cardiovascular Disease | Admitting: Cardiovascular Disease

## 2017-11-06 ENCOUNTER — Other Ambulatory Visit: Payer: Self-pay

## 2017-11-06 DIAGNOSIS — Z951 Presence of aortocoronary bypass graft: Secondary | ICD-10-CM | POA: Diagnosis not present

## 2017-11-06 DIAGNOSIS — I200 Unstable angina: Secondary | ICD-10-CM | POA: Diagnosis present

## 2017-11-06 DIAGNOSIS — I25710 Atherosclerosis of autologous vein coronary artery bypass graft(s) with unstable angina pectoris: Secondary | ICD-10-CM | POA: Insufficient documentation

## 2017-11-06 DIAGNOSIS — Z7982 Long term (current) use of aspirin: Secondary | ICD-10-CM | POA: Diagnosis not present

## 2017-11-06 DIAGNOSIS — E109 Type 1 diabetes mellitus without complications: Secondary | ICD-10-CM | POA: Diagnosis not present

## 2017-11-06 DIAGNOSIS — I25111 Atherosclerotic heart disease of native coronary artery with angina pectoris with documented spasm: Secondary | ICD-10-CM | POA: Diagnosis not present

## 2017-11-06 DIAGNOSIS — I25720 Atherosclerosis of autologous artery coronary artery bypass graft(s) with unstable angina pectoris: Principal | ICD-10-CM | POA: Insufficient documentation

## 2017-11-06 DIAGNOSIS — I259 Chronic ischemic heart disease, unspecified: Secondary | ICD-10-CM

## 2017-11-06 DIAGNOSIS — I25119 Atherosclerotic heart disease of native coronary artery with unspecified angina pectoris: Secondary | ICD-10-CM | POA: Diagnosis not present

## 2017-11-06 DIAGNOSIS — E782 Mixed hyperlipidemia: Secondary | ICD-10-CM

## 2017-11-06 DIAGNOSIS — I2582 Chronic total occlusion of coronary artery: Secondary | ICD-10-CM | POA: Diagnosis not present

## 2017-11-06 DIAGNOSIS — E785 Hyperlipidemia, unspecified: Secondary | ICD-10-CM | POA: Diagnosis not present

## 2017-11-06 DIAGNOSIS — M109 Gout, unspecified: Secondary | ICD-10-CM | POA: Insufficient documentation

## 2017-11-06 DIAGNOSIS — R072 Precordial pain: Secondary | ICD-10-CM | POA: Diagnosis not present

## 2017-11-06 HISTORY — PX: LEFT HEART CATH AND CORS/GRAFTS ANGIOGRAPHY: CATH118250

## 2017-11-06 HISTORY — PX: CARDIAC CATHETERIZATION: SHX172

## 2017-11-06 LAB — BASIC METABOLIC PANEL
ANION GAP: 8 (ref 5–15)
BUN: 18 mg/dL (ref 6–20)
CALCIUM: 9.5 mg/dL (ref 8.9–10.3)
CO2: 23 mmol/L (ref 22–32)
CREATININE: 1.18 mg/dL (ref 0.61–1.24)
Chloride: 106 mmol/L (ref 101–111)
Glucose, Bld: 228 mg/dL — ABNORMAL HIGH (ref 65–99)
Potassium: 4 mmol/L (ref 3.5–5.1)
SODIUM: 137 mmol/L (ref 135–145)

## 2017-11-06 LAB — I-STAT TROPONIN, ED: TROPONIN I, POC: 0 ng/mL (ref 0.00–0.08)

## 2017-11-06 LAB — CBC
HCT: 45.5 % (ref 39.0–52.0)
HEMOGLOBIN: 15.1 g/dL (ref 13.0–17.0)
MCH: 32.5 pg (ref 26.0–34.0)
MCHC: 33.2 g/dL (ref 30.0–36.0)
MCV: 98.1 fL (ref 78.0–100.0)
PLATELETS: 177 10*3/uL (ref 150–400)
RBC: 4.64 MIL/uL (ref 4.22–5.81)
RDW: 13.4 % (ref 11.5–15.5)
WBC: 5.3 10*3/uL (ref 4.0–10.5)

## 2017-11-06 LAB — GLUCOSE, CAPILLARY
GLUCOSE-CAPILLARY: 128 mg/dL — AB (ref 65–99)
Glucose-Capillary: 99 mg/dL (ref 65–99)

## 2017-11-06 SURGERY — LEFT HEART CATH AND CORS/GRAFTS ANGIOGRAPHY
Anesthesia: LOCAL

## 2017-11-06 MED ORDER — SODIUM CHLORIDE 0.9% FLUSH: 3.0000 mL | INTRAVENOUS | Status: DC | PRN

## 2017-11-06 MED ORDER — FENTANYL CITRATE (PF) 100 MCG/2ML IJ SOLN
INTRAMUSCULAR | Status: DC | PRN
  Administered 2017-11-06: 50 ug via INTRAVENOUS

## 2017-11-06 MED ORDER — IOPAMIDOL (ISOVUE-370) INJECTION 76%
INTRAVENOUS | Status: DC | PRN
  Administered 2017-11-06: 90 mL via INTRA_ARTERIAL

## 2017-11-06 MED ORDER — HEPARIN SODIUM (PORCINE) 1000 UNIT/ML IJ SOLN
INTRAMUSCULAR | Status: DC | PRN
  Administered 2017-11-06: 4000 [IU] via INTRAVENOUS

## 2017-11-06 MED ORDER — IOPAMIDOL (ISOVUE-370) INJECTION 76%
INTRAVENOUS | Status: AC
  Filled 2017-11-06: qty 125

## 2017-11-06 MED ORDER — SODIUM CHLORIDE 0.9 % IV SOLN: INTRAVENOUS | Status: AC

## 2017-11-06 MED ORDER — SODIUM CHLORIDE 0.9 % IV SOLN: 250.0000 mL | INTRAVENOUS | Status: DC | PRN

## 2017-11-06 MED ORDER — HEPARIN SODIUM (PORCINE) 1000 UNIT/ML IJ SOLN
INTRAMUSCULAR | Status: AC
  Filled 2017-11-06: qty 1

## 2017-11-06 MED ORDER — HEPARIN (PORCINE) IN NACL 2-0.9 UNIT/ML-% IJ SOLN
INTRAMUSCULAR | Status: AC
  Filled 2017-11-06: qty 1000

## 2017-11-06 MED ORDER — INSULIN ASPART 100 UNIT/ML ~~LOC~~ SOLN: 0.0000 [IU] | Freq: Three times a day (TID) | SUBCUTANEOUS | Status: DC

## 2017-11-06 MED ORDER — ONDANSETRON HCL 4 MG/2ML IJ SOLN: 4.0000 mg | Freq: Four times a day (QID) | INTRAMUSCULAR | Status: DC | PRN

## 2017-11-06 MED ORDER — NITROGLYCERIN 0.4 MG SL SUBL: 0.4000 mg | SUBLINGUAL_TABLET | SUBLINGUAL | Status: DC | PRN

## 2017-11-06 MED ORDER — ASPIRIN 81 MG PO CHEW: 324.0000 mg | CHEWABLE_TABLET | ORAL | Status: AC

## 2017-11-06 MED ORDER — SODIUM CHLORIDE 0.9% FLUSH: 3.0000 mL | Freq: Two times a day (BID) | INTRAVENOUS | Status: DC

## 2017-11-06 MED ORDER — LIDOCAINE HCL (PF) 1 % IJ SOLN
INTRAMUSCULAR | Status: DC | PRN
  Administered 2017-11-06: 2 mL via SUBCUTANEOUS

## 2017-11-06 MED ORDER — LIDOCAINE HCL (PF) 1 % IJ SOLN
INTRAMUSCULAR | Status: AC
  Filled 2017-11-06: qty 30

## 2017-11-06 MED ORDER — ASPIRIN EC 81 MG PO TBEC
81.0000 mg | DELAYED_RELEASE_TABLET | Freq: Every day | ORAL | Status: DC
  Administered 2017-11-07: 81 mg via ORAL
  Filled 2017-11-06: qty 1

## 2017-11-06 MED ORDER — ALLOPURINOL 100 MG PO TABS
100.0000 mg | ORAL_TABLET | Freq: Every day | ORAL | Status: DC
  Administered 2017-11-07: 09:00:00 100 mg via ORAL
  Filled 2017-11-06: qty 1

## 2017-11-06 MED ORDER — ACETAMINOPHEN 325 MG PO TABS: 650.0000 mg | ORAL_TABLET | ORAL | Status: DC | PRN

## 2017-11-06 MED ORDER — ATORVASTATIN CALCIUM 20 MG PO TABS
20.0000 mg | ORAL_TABLET | Freq: Every day | ORAL | Status: DC
  Administered 2017-11-06: 19:00:00 20 mg via ORAL
  Filled 2017-11-06: qty 1

## 2017-11-06 MED ORDER — MIDAZOLAM HCL 2 MG/2ML IJ SOLN
INTRAMUSCULAR | Status: AC
  Filled 2017-11-06: qty 2

## 2017-11-06 MED ORDER — VERAPAMIL HCL 2.5 MG/ML IV SOLN
INTRAVENOUS | Status: AC
  Filled 2017-11-06: qty 2

## 2017-11-06 MED ORDER — HEPARIN (PORCINE) IN NACL 2-0.9 UNIT/ML-% IJ SOLN
INTRAMUSCULAR | Status: AC | PRN
  Administered 2017-11-06: 1000 mL

## 2017-11-06 MED ORDER — MIDAZOLAM HCL 2 MG/2ML IJ SOLN
INTRAMUSCULAR | Status: DC | PRN
  Administered 2017-11-06: 2 mg via INTRAVENOUS

## 2017-11-06 MED ORDER — SODIUM CHLORIDE 0.9 % IV SOLN
INTRAVENOUS | Status: AC | PRN
  Administered 2017-11-06: 10 mL/h via INTRAVENOUS

## 2017-11-06 MED ORDER — ASPIRIN 300 MG RE SUPP: 300.0000 mg | RECTAL | Status: AC

## 2017-11-06 MED ORDER — FENTANYL CITRATE (PF) 100 MCG/2ML IJ SOLN
INTRAMUSCULAR | Status: AC
  Filled 2017-11-06: qty 2

## 2017-11-06 MED ORDER — VERAPAMIL HCL 2.5 MG/ML IV SOLN
INTRAVENOUS | Status: DC | PRN
  Administered 2017-11-06: 16:00:00 via INTRA_ARTERIAL

## 2017-11-06 SURGICAL SUPPLY — 12 items
CATH INFINITI 5 FR IM (CATHETERS) ×2 IMPLANT
CATH INFINITI 5 FR LCB (CATHETERS) ×2 IMPLANT
CATH INFINITI 5FR MULTPACK ANG (CATHETERS) ×2 IMPLANT
DEVICE RAD COMP TR BAND LRG (VASCULAR PRODUCTS) ×2 IMPLANT
GLIDESHEATH SLEND SS 6F .021 (SHEATH) ×2 IMPLANT
GUIDEWIRE INQWIRE 1.5J.035X260 (WIRE) ×1 IMPLANT
INQWIRE 1.5J .035X260CM (WIRE) ×2
KIT HEART LEFT (KITS) ×2 IMPLANT
PACK CARDIAC CATHETERIZATION (CUSTOM PROCEDURE TRAY) ×2 IMPLANT
SYR MEDRAD MARK V 150ML (SYRINGE) ×2 IMPLANT
TRANSDUCER W/STOPCOCK (MISCELLANEOUS) ×2 IMPLANT
TUBING CIL FLEX 10 FLL-RA (TUBING) ×2 IMPLANT

## 2017-11-06 NOTE — Progress Notes (Signed)
TR BAND REMOVAL  LOCATION:    left radial  DEFLATED PER PROTOCOL:    Yes.    TIME BAND OFF / DRESSING APPLIED:    1845   SITE UPON ARRIVAL:    Level 0  SITE AFTER BAND REMOVAL:    Level 0  CIRCULATION SENSATION AND MOVEMENT:    Within Normal Limits   Yes.    COMMENTS:   Tolerated procedure well

## 2017-11-06 NOTE — Interval H&P Note (Signed)
History and Physical Interval Note:  11/06/2017 3:54 PM  Larna DaughtersWilliam Driscoll  has presented today for cardiac cath with the diagnosis of unstable angina. The various methods of treatment have been discussed with the patient and family. After consideration of risks, benefits and other options for treatment, the patient has consented to  Procedure(s): LEFT HEART CATH AND CORS/GRAFTS ANGIOGRAPHY (N/A) as a surgical intervention .  The patient's history has been reviewed, patient examined, no change in status, stable for surgery.  I have reviewed the patient's chart and labs.  Questions were answered to the patient's satisfaction.   Cath Lab Visit (complete for each Cath Lab visit)  Clinical Evaluation Leading to the Procedure:   ACS: No.  Non-ACS:    Anginal Classification: CCS III  Anti-ischemic medical therapy: No Therapy  Non-Invasive Test Results: No non-invasive testing performed  Prior CABG: No previous CABG            Verne Carrowhristopher McAlhany

## 2017-11-06 NOTE — ED Notes (Signed)
Pt signed inform consent for cardiac catheterization.

## 2017-11-06 NOTE — ED Notes (Signed)
Pt removed clothing and wedding ring, wife has pt's belongings.

## 2017-11-06 NOTE — H&P (Signed)
Cardiology Admission History and Physical:   Patient ID: [name]; MRN: [id number]; DOB: 1946-11-20   Admission date: 11/06/2017  Primary Care Provider: Daisy Florooss, Charles Alan, MD Primary Cardiologist: Dr. Anne FuSkains  Chief Complaint:  Chest pain   Patient Profile:   [name] is a 71 y.o. male with a history of CAD s/p CABG, DM and HLD presented for chest pain.   Cardiac catheterization done by Dr. Anne FuSkains on 04/16/2012: Left main: Minor luminal irregularities of the left main artery, branches into the LAD and circumflex.  Left anterior descending (LAD): Proximal LAD stenosis of 90%. 2 small sized diagonal vessels. The remainder of the LAD has minor luminal irregularities. The 90% stenosis proximally is at the bifurcation of the first septal branch and diagonal branch.  Circumflex artery (CIRC): The AV groove circumflex is 100% occluded in the midsegment with left to right collaterals retrograde filling the vessel. The second obtuse marginal branch is small in caliber, sluggish flow. The first, large obtuse marginal branch has minor luminal irregularities and encompasses a large portion of the lateral wall distribution.  Right coronary artery (RCA): Proximal 95% stenosis as well as distal 60% stenosis before the bifurcation of the PDA. This is the dominant vessel.  LEFT VENTRICULOGRAM: Left ventricular angiogram was done in the 30 RAO projection and revealed normal left ventricular wall motion and systolic function with an estimated ejection fraction of 55%.   S/p CORONARY ARTERY BYPASS GRAFTING (CABG) x 3 (LIMA-LAD, SVG-dCx, SVG- PD), EVH right leg by Dr. Laneta SimmersBartle on 04/19/2012.  No cardiology follow up since then. [facility] and does exercise for approximately 90 minutes without angina.   History of Present Illness:   [name] was in [facility] up until last week. He had done heavy yard/tree work on Thursday and Friday. While working on Friday he had severe substernal chest  pressure with SOB and pain radiating to his neck. Relived with rest. Episode again happed while workup on Saturday. Since then he has intermittent episode. Fatigue and tired with dizziness. This morning he had chest tightness with breakfast and came to ER for further evaluation. Did not used nitro. Pain is similar to prior angina. Currently chest pain free.  Electrolytes and Scr are normal. POC troponin negative. CXR clear.    Past Medical History:  Diagnosis Date  . Clostridium difficile infection   . Coronary artery disease    CARDIOLOGIST--  dr Anne Fuskains  . Gout   . History of kidney stones   . History of skin cancer   . Left ureteral calculus   . S/P CABG x 3    04-19-2012  . Type 2 diabetes mellitus (HCC)   . Urgency of urination   . Wears partial dentures    upper and lower    Past Surgical History:  Procedure Laterality Date  . CARDIAC CATHETERIZATION  04/16/2012 dr Anne Fuskains   severe 3 vessel CAD/  pLAD 90%,  pRCA 95%, total occluded CFX/  normal LVSF/  diastolic dysfunction  . CARDIOVASCULAR STRESS TEST  04/2012  . COLONOSCOPY WITH PROPOFOL N/A 08/23/2017   Procedure: COLONOSCOPY WITH PROPOFOL;  Surgeon: Willis Modenautlaw, Andrews, MD;  [facility];  Service: Endoscopy;  Laterality: N/A;  . CORONARY ARTERY BYPASS GRAFT  04/19/2012   Procedure: CORONARY ARTERY BYPASS GRAFTING (CABG);  Surgeon: Alleen BorneBryan K Bartle, MD;  [facility];  Service: Open Heart Surgery;  Laterality: N/A; LIMA to LAD, SVG to CFX, SVG to PDA  . CYSTOLITHAPAXY W/ HOLMIUM LASER  AND TRANSURETHRAL INCISION OF PROSTATE  10-23-2006  . CYSTOSCOPY WITH RETROGRADE PYELOGRAM, URETEROSCOPY AND STENT PLACEMENT Left 11/02/2014   Procedure: CYSTOSCOPY WITH RETROGRADE PYELOGRAM, URETEROSCOPY, EXTRACTION OF STONES AND STENT PLACEMENT;  Surgeon: Chelsea AusStephen M Dahlstedt, MD;  [facility] [place];  Service: Urology;  Laterality: Left;  . CYSTOSCOPY WITH URETEROSCOPY, STONE BASKETRY AND STENT PLACEMENT Right 12/01/2013     Procedure: CYSTOSCOPY WITH URETEROSCOPY, RIGHT RETROGRADE PYLEGRAM ,  STONE REMOVAL,  AND STENT PLACEMENT;  Surgeon: Marcine MatarStephen Dahlstedt, MD;  Location: [facility];  Service: Urology;  Laterality: Right;  . FECAL TRANSPLANT N/A 08/23/2017   Procedure: FECAL TRANSPLANT;  Surgeon: Willis Modenautlaw, Jarome, MD;  [facility];  Service: Endoscopy;  Laterality: N/A;  . HOLMIUM LASER APPLICATION Right 12/01/2013   Procedure: HOLMIUM LASER APPLICATION;  Surgeon: Marcine MatarStephen Dahlstedt, MD;  Location: [facility];  Service: Urology;  Laterality: Right;  . HOLMIUM LASER APPLICATION Left 11/02/2014   Procedure: HOLMIUM LASER APPLICATION;  Surgeon: Chelsea AusStephen M Dahlstedt, MD;  [facility] [place];  Service: Urology;  Laterality: Left;  . INCISION AND DRAINAGE ABSCESS N/A 12/25/2016   Procedure: INCISION AND DRAINAGE SUBMENTAL ABSCESS;  Surgeon: Christia Readingwight Bates, MD;  [facility];  Service: ENT;  Laterality: N/A;  . KNEE ARTHROSCOPY Left 1994  . LEFT HEART CATHETERIZATION WITH CORONARY ANGIOGRAM N/A 04/16/2012   Procedure: LEFT HEART CATHETERIZATION WITH CORONARY ANGIOGRAM;  Surgeon: Donato SchultzMark Skains, MD;  [facility];  Service: Cardiovascular;  Laterality: N/A;  . SKIN CANCER EXCISION  2014  . URETEROSCOPIC STONE EXTRACTION W/ STENT PLACEMENT Bilateral bilateral 06-23-2011//  right 09-16-2007//  left 07-02-2009     Medications Prior to Admission: Prior to Admission medications   Medication Sig Start Date End Date Taking? Authorizing Provider  allopurinol (ZYLOPRIM) 100 MG tablet Take 100 mg by mouth daily.     [provider]  aspirin 325 MG EC tablet Take 325 mg by mouth daily.    [provider]  atorvastatin (LIPITOR) 20 MG tablet Take 20 mg by mouth daily with supper.     [provider]  metFORMIN (GLUCOPHAGE-XR) 500 MG 24 hr tablet Take 500 mg by mouth 2 (two) times daily with a meal.    [provider]     Allergies:   No Known Allergies  Social History:    Social History   Socioeconomic History  . Marital status: Married    Spouse name: Not on file  . Number of children: Not on file  . Years of education: Not on file  . Highest education level: Not on file  Social Needs  . Financial resource strain: Not on file  . Food insecurity - worry: Not on file  . Food insecurity - inability: Not on file  . Transportation needs - medical: Not on file  . Transportation needs - non-medical: Not on file  Occupational History  . Not on file  Tobacco Use  . Smoking status: Never Smoker  . Smokeless tobacco: Never Used  Substance and Sexual Activity  . Alcohol use: Yes    Comment: occasional  . Drug use: No  . Sexual activity: No  Other Topics Concern  . Not on file  Social History Narrative  . Not on file    Family History:   The patient's family history includes Alcohol abuse in his father; Congestive Heart Failure in his mother; Diabetes in his unknown relative; Heart attack in his maternal uncle; Heart failure in his unknown relative; Kidney Stones in  his unknown relative.    ROS:  Please see the history of present illness.  All other ROS reviewed and negative.     Physical Exam/Data:   Vitals:   11/06/17 1315 11/06/17 1330 11/06/17 1345 11/06/17 1400  BP: 129/78 132/75 125/72 123/75  Pulse: (!) 59 61 (!) 58 (!) 58  Resp: 19 16 17 16   Temp:      TempSrc:      SpO2: 96% 96% 96% 95%  Weight:      Height:       No intake or output data in the 24 hours ending 11/06/17 1504 Filed Weights   11/06/17 1044  Weight: 180 lb (81.6 kg)   Body mass index is 27.37 kg/m.  General:  Well nourished, well developed, in no acute distress HEENT: normal Lymph: no adenopathy Neck: no JVD Endocrine:  No thryomegaly Vascular: No carotid bruits; FA pulses 2+ bilaterally without bruits  Cardiac:  normal S1, S2; RRR; no murmur Lungs:  clear to auscultation bilaterally, no wheezing, rhonchi or rales  Abd: soft, nontender, no hepatomegaly   Ext: no  edema Musculoskeletal:  No deformities, BUE and BLE strength normal and equal Skin: warm and dry  Neuro:  CNs 2-12 intact, no focal abnormalities noted Psych:  Normal affect    EKG:  The ECG that was done today personally reviewed and demonstrates NSR with acute ischemic changes  Relevant CV Studies: As above  Laboratory Data:  Chemistry Recent Labs  Lab 11/06/17 1043  NA 137  K 4.0  CL 106  CO2 23  GLUCOSE 228*  BUN 18  CREATININE 1.18  CALCIUM 9.5  GFRNONAA >60  GFRAA >60  ANIONGAP 8    No results for input(s): PROT, ALBUMIN, AST, ALT, ALKPHOS, BILITOT in the last 168 hours. Hematology Recent Labs  Lab 11/06/17 1043  WBC 5.3  RBC 4.64  HGB 15.1  HCT 45.5  MCV 98.1  MCH 32.5  MCHC 33.2  RDW 13.4  PLT 177    Recent Labs  Lab 11/06/17 1109  TROPIPOC 0.00   Radiology/Studies:  Dg Chest 2 View  Result Date: 11/06/2017 CLINICAL DATA:  Chest pain EXAM: CHEST  2 VIEW COMPARISON:  02/18/2016 FINDINGS: CABG. Heart size normal. Negative for heart failure. Lungs are clear without infiltrate effusion or mass. IMPRESSION: No active cardiopulmonary disease. Electronically Signed   By: Marlan Palau M.D.   On: 11/06/2017 11:13    Assessment and Plan:   1. Accelerating angina - His symptom is classic. Exertional for the past 4 days, now at rest. Currently chest pain free. Similar to prior angina.  - Will do cath today.   The patient understands that risks include but are not limited to stroke (1 in 1000), death (1 in 1000), kidney failure [usually temporary] (1 in 500), bleeding (1 in 200), allergic reaction [possibly serious] (1 in 200), and agrees to proceed.   2. CAD s/p CABG - No regular cardiology follow up since CABG in 2013.   3. HLD - Continue statin. States that his lipids are well controlled. Wait cath prior to adjustment. Advised to bring labs during follow up.   4. DM - Hold metformin. SSI while here.   Severity of Illness: The  appropriate patient status for this patient is OBSERVATION. Observation status is judged to be reasonable and necessary in order to provide the required intensity of service to ensure the patient's safety. The patient's presenting symptoms, physical exam findings, and initial radiographic and laboratory data  in the context of their medical condition is felt to place them at decreased risk for further clinical deterioration. Furthermore, it is anticipated that the patient will be medically stable for discharge from the hospital within 2 midnights of admission. The following factors support the patient status of observation.   " The patient's presenting symptoms include. Chest pressure radiating to his neck " The physical exam findings include N/A  " The initial radiographic and laboratory data are N/a  For questions or updates, please contact [facility] Please consult [email or web address] for contact info under Cardiology/STEMI.    Vonzella Nipple [place], [place]  11/06/2017 3:04 PM   Attending Note:   The patient was seen and examined.  Agree with assessment and plan as noted above.  Changes made to the above note as needed.  Patient seen and independently examined with [name], PA .   We discussed all aspects of the encounter. I agree with the assessment and plan as stated above.  1.  Unstable angina: The patient presents with symptoms that are consistent with unstable angina.  He has a history of coronary artery bypass grafting from 5 years ago.  He has not been seen by cardiology since that time.  He is been followed by his primary medical doctor and his cholesterol levels have been well controlled.  He works out at [employer] 6 times a week.  This past Thursday he was doing lots of yard work and started having chest tightness and pressure with exertion.  It radiated across his chest.  It was associated with some shortness of breath.  He denies any nausea or vomiting, syncope or presyncope.  The  symptoms resolved when he stopped to rest.  They returned when he started exercising/working again.  He rested on Sunday but decided to go for a walk.  He had a return of these chest pain symptoms while walking around the block.  These pains are very similar to his previous episodes of angina prior to his coronary artery bypass grafting.  The symptoms are not quite as severe but are very similar.  I think that he needs to go for heart catheterization.  We discussed the risks, benefits, and options.  He understands and agrees to proceed.  2.  Hyperlipidemia: We will check fasting lipids tomorrow.   I have spent a total of 40 minutes with patient reviewing hospital  notes , telemetry, EKGs, labs and examining patient as well as establishing an assessment and plan that was discussed with the patient. > 50% of time was spent in direct patient care.    Vesta Mixer, Montez Hageman., MD, [facility] 11/06/2017, 3:40 PM [address],  [address] Office [phone] Pager [phone]

## 2017-11-06 NOTE — ED Provider Notes (Signed)
[facility] CARD[facility] CATH [facility] Provider Note   CSN: [id number] Arrival date & time: 11/06/17  1026     History   Chief Complaint Chief Complaint  Patient presents with  . Chest Pain  . Dizziness    HPI [name] is a 71 y.o. male.  HPI   [name] DaughtersWilliam Huhn is a 71 y.o. male, with a history of CABG, C. difficile, DM, presenting to the ED with chest discomfort intermittent for the last 4 days.  States that discomfort began while he was finishing up some heavy yard work.  Central chest, described as a "stiffness or tightness," 6/10, accompanied by shortness of breath and lightheadedness.  Also endorses some discomfort to the neck and left shoulder. Symptoms arise with exertion and resolves with rest. Generalized weakness and fatigue over past few days.   Symptoms are similar to that which preceded his CABG x3 five years ago.  Takes 325mg  ASA daily, which he took this morning.    Cardiologist: Dr. Anne FuSkains, but has not been under his care for last few years since.  States he went to all of his follow-up appointments and was then cleared by Dr. Anne FuSkains.  Denies N/V/D, fever/chills, falls/trauma, abdominal pain, peripheral edema, cough, or any other complaints.   Past Medical History:  Diagnosis Date  . Clostridium difficile infection   . Coronary artery disease    CARDIOLOGIST--  dr Anne Fuskains  . Gout   . History of kidney stones   . History of skin cancer   . Left ureteral calculus   . S/P CABG x 3    04-19-2012  . Type 2 diabetes mellitus (HCC)   . Urgency of urination   . Wears partial dentures    upper and lower    Patient Active Problem List   Diagnosis Date Noted  . Submental abscess 12/25/2016  . Intermediate coronary syndrome (HCC) 04/16/2012  . Coronary atherosclerosis of native coronary artery 04/16/2012  . Type II or unspecified type diabetes mellitus without mention of complication, not stated as uncontrolled 04/16/2012    Past Surgical  History:  Procedure Laterality Date  . CARDIAC CATHETERIZATION  04/16/2012 dr Anne Fuskains   severe 3 vessel CAD/  pLAD 90%,  pRCA 95%, total occluded CFX/  normal LVSF/  diastolic dysfunction  . CARDIOVASCULAR STRESS TEST  04/2012  . COLONOSCOPY WITH PROPOFOL N/A 08/23/2017   Procedure: COLONOSCOPY WITH PROPOFOL;  Surgeon: Willis Modenautlaw, Mekel, MD;  [facility];  Service: Endoscopy;  Laterality: N/A;  . CORONARY ARTERY BYPASS GRAFT  04/19/2012   Procedure: CORONARY ARTERY BYPASS GRAFTING (CABG);  Surgeon: Alleen BorneBryan K Bartle, MD;  [facility];  Service: Open Heart Surgery;  Laterality: N/A; LIMA to LAD, SVG to CFX, SVG to PDA  . CYSTOLITHAPAXY W/ HOLMIUM LASER AND TRANSURETHRAL INCISION OF PROSTATE  10-23-2006  . CYSTOSCOPY WITH RETROGRADE PYELOGRAM, URETEROSCOPY AND STENT PLACEMENT Left 11/02/2014   Procedure: CYSTOSCOPY WITH RETROGRADE PYELOGRAM, URETEROSCOPY, EXTRACTION OF STONES AND STENT PLACEMENT;  Surgeon: Chelsea AusStephen M Dahlstedt, MD;  [facility] [place];  Service: Urology;  Laterality: Left;  . CYSTOSCOPY WITH URETEROSCOPY, STONE BASKETRY AND STENT PLACEMENT Right 12/01/2013   Procedure: CYSTOSCOPY WITH URETEROSCOPY, RIGHT RETROGRADE PYLEGRAM ,  STONE REMOVAL,  AND STENT PLACEMENT;  Surgeon: Marcine MatarStephen Dahlstedt, MD;  Location[facility];  Service: Urology;  Laterality: Right;  . FECAL TRANSPLANT N/A 08/23/2017   Procedure: FECAL TRANSPLANT;  Surgeon: Willis Modenautlaw, Telesforo, MD;  [facility];  Service: Endoscopy;  Laterality: N/A;  .  HOLMIUM LASER APPLICATION Right 12/01/2013   Procedure: HOLMIUM LASER APPLICATION;  Surgeon: Marcine MatarStephen Dahlstedt, MD;  Location: [facility];  Service: Urology;  Laterality: Right;  . HOLMIUM LASER APPLICATION Left 11/02/2014   Procedure: HOLMIUM LASER APPLICATION;  Surgeon: Chelsea AusStephen M Dahlstedt, MD;  [facility] [place];  Service: Urology;  Laterality: Left;  . INCISION AND DRAINAGE ABSCESS N/A 12/25/2016   Procedure: INCISION AND DRAINAGE  SUBMENTAL ABSCESS;  Surgeon: Christia Readingwight Bates, MD;  [facility];  Service: ENT;  Laterality: N/A;  . KNEE ARTHROSCOPY Left 1994  . LEFT HEART CATHETERIZATION WITH CORONARY ANGIOGRAM N/A 04/16/2012   Procedure: LEFT HEART CATHETERIZATION WITH CORONARY ANGIOGRAM;  Surgeon: Donato SchultzMark Skains, MD;  [facility];  Service: Cardiovascular;  Laterality: N/A;  . SKIN CANCER EXCISION  2014  . URETEROSCOPIC STONE EXTRACTION W/ STENT PLACEMENT Bilateral bilateral 06-23-2011//  right 09-16-2007//  left 07-02-2009       Home Medications    Prior to Admission medications   Medication Sig Start Date End Date Taking? Authorizing Provider  allopurinol (ZYLOPRIM) 100 MG tablet Take 100 mg by mouth daily.     [provider]  aspirin 325 MG EC tablet Take 325 mg by mouth daily.    [provider]  atorvastatin (LIPITOR) 20 MG tablet Take 20 mg by mouth daily with supper.     [provider]  metFORMIN (GLUCOPHAGE-XR) 500 MG 24 hr tablet Take 500 mg by mouth 2 (two) times daily with a meal.    [provider]    Family History Family History  Problem Relation Age of Onset  . Congestive Heart Failure Mother   . Alcohol abuse Father   . Kidney Stones Unknown   . Heart failure Unknown   . Diabetes Unknown   . Heart attack Maternal Uncle        multiple mat. uncles in their 500's     Social History Social History   Tobacco Use  . Smoking status: Never Smoker  . Smokeless tobacco: Never Used  Substance Use Topics  . Alcohol use: Yes    Comment: occasional  . Drug use: No     Allergies   Patient has no known allergies.   Review of Systems Review of Systems  Constitutional: Positive for fatigue. Negative for chills and fever.  Respiratory: Positive for shortness of breath. Negative for cough.   Cardiovascular: Positive for chest pain. Negative for leg swelling.  Gastrointestinal: Negative for abdominal pain, diarrhea, nausea and vomiting.    Musculoskeletal: Negative for back pain.  Neurological: Positive for weakness (generalized) and light-headedness.  All other systems reviewed and are negative.    Physical Exam Updated Vital Signs BP (!) 161/86 (BP Location: Right Arm)   Pulse 70   Temp 98.6 F (37 C) (Oral)   Resp 16   Ht 5\' 8"  (1.727 m)   Wt 81.6 kg (180 lb)   SpO2 100%   BMI 27.37 kg/m   Physical Exam  Constitutional: He appears well-developed and well-nourished. No distress.  HENT:  Head: Normocephalic and atraumatic.  Eyes: Conjunctivae are normal.  Neck: Neck supple.  Cardiovascular: Normal rate, regular rhythm, normal heart sounds and intact distal pulses.  Pulmonary/Chest: Effort normal and breath sounds normal. No respiratory distress.  No noted increased work of breathing.  Patient speaks in full sentences without difficulty.  Abdominal: Soft. There is no tenderness. There is no guarding.  Musculoskeletal: He exhibits no edema.  Lymphadenopathy:    He has  no cervical adenopathy.  Neurological: He is alert.  Skin: Skin is warm and dry. He is not diaphoretic. No pallor.  Psychiatric: He has a normal mood and affect. His behavior is normal.  Nursing note and vitals reviewed.    ED Treatments / Results  Labs (all labs ordered are listed, but only abnormal results are displayed) Labs Reviewed  BASIC METABOLIC PANEL - Abnormal; Notable for the following components:      Result Value   Glucose, Bld 228 (*)    All other components within normal limits  CBC  I-STAT TROPONIN, ED    EKG  EKG Interpretation  Date/Time:  Tuesday November 06 2017 10:43:27 EST Ventricular Rate:  68 PR Interval:  186 QRS Duration: 66 QT Interval:  386 QTC Calculation: 410 R Axis:   23 Text Interpretation:  Normal sinus rhythm Nonspecific T wave abnormality Confirmed by Cathren Laine ([id number]) on 11/06/2017 12:43:56 PM       Radiology Dg Chest 2 View  Result Date: 11/06/2017 CLINICAL DATA:  Chest pain  EXAM: CHEST  2 VIEW COMPARISON:  02/18/2016 FINDINGS: CABG. Heart size normal. Negative for heart failure. Lungs are clear without infiltrate effusion or mass. IMPRESSION: No active cardiopulmonary disease. Electronically Signed   By: Marlan Palau M.D.   On: 11/06/2017 11:13    Procedures Procedures (including critical care time)  Medications Ordered in ED Medications - No data to display   Initial Impression / Assessment and Plan / ED Course  I have reviewed the triage vital signs and the nursing notes.  Pertinent labs & imaging results that were available during my care of the patient were reviewed by me and considered in my medical decision making (see chart for details).  Clinical Course as of Nov 06 1557  Tue Nov 06, 2017  1142 Patient not yet in the room.  [SJ]  1310 Spoke with Trish, cardiology. States they will send someone down to evaluate the patient.  [SJ]    Clinical Course User Index [SJ] Leopoldo Mazzie C, PA-C    Patient presents with chest pain, suspicious for cardiac origin.  Patient hemodynamically stable.  Nontoxic-appearing.  Patient reportedly to go to cardiac cath lab per H&P by Dr. Elease Hashimoto.   Findings and plan of care discussed with Cathren Laine, MD. Dr. Denton Lank personally evaluated and examined this patient.  Vitals:   11/06/17 1044 11/06/17 1048 11/06/17 1225  BP:  (!) 161/86   Pulse:  68 70  Resp:  16 16  Temp:  98.6 F (37 C)   TempSrc:  Oral   SpO2:  97% 100%  Weight: 81.6 kg (180 lb)    Height: 5\' 8"  (1.727 m)     Vitals:   11/06/17 1330 11/06/17 1345 11/06/17 1400 11/06/17 1500  BP: 132/75 125/72 123/75 (!) 146/86  Pulse: 61 (!) 58 (!) 58 66  Resp: 16 17 16 17   Temp:      TempSrc:      SpO2: 96% 96% 95% 98%  Weight:      Height:         Final Clinical Impressions(s) / ED Diagnoses   Final diagnoses:  Chest pain due to myocardial ischemia, unspecified ischemic chest pain type    ED Discharge Orders    None       Concepcion Living 11/06/17 1605    Cathren Laine, MD 11/07/17 [phone]

## 2017-11-06 NOTE — ED Triage Notes (Signed)
PT reports that he has history of heart troubles. Pt reports the he is having chest tightness that started on Friday night and into Saturday morning he began to feel lightheaded, dizzy, and weak. C/O "feeling in my throat that I had when I had my last blockages". Also reports feeling weak, jittery, and "funny" in legs and arms.

## 2017-11-07 ENCOUNTER — Encounter (HOSPITAL_COMMUNITY): Payer: Self-pay | Admitting: Cardiovascular Disease

## 2017-11-07 DIAGNOSIS — I200 Unstable angina: Secondary | ICD-10-CM | POA: Diagnosis not present

## 2017-11-07 DIAGNOSIS — I25119 Atherosclerotic heart disease of native coronary artery with unspecified angina pectoris: Secondary | ICD-10-CM | POA: Diagnosis not present

## 2017-11-07 LAB — LIPID PANEL
CHOL/HDL RATIO: 2.3 ratio
CHOLESTEROL: 98 mg/dL (ref 0–200)
HDL: 42 mg/dL (ref >40)
LDL Cholesterol: 45 mg/dL (ref 0–99)
Triglycerides: 57 mg/dL (ref <150)
VLDL: 11 mg/dL (ref 0–40)

## 2017-11-07 LAB — CBC
HCT: 42.3 % (ref 39.0–52.0)
Hemoglobin: 14.1 g/dL (ref 13.0–17.0)
MCH: 32.2 pg (ref 26.0–34.0)
MCHC: 33.3 g/dL (ref 30.0–36.0)
MCV: 96.6 fL (ref 78.0–100.0)
PLATELETS: 164 10*3/uL (ref 150–400)
RBC: 4.38 MIL/uL (ref 4.22–5.81)
RDW: 13.2 % (ref 11.5–15.5)
WBC: 6.9 10*3/uL (ref 4.0–10.5)

## 2017-11-07 LAB — BASIC METABOLIC PANEL
Anion gap: 5 (ref 5–15)
BUN: 19 mg/dL (ref 6–20)
CHLORIDE: 107 mmol/L (ref 101–111)
CO2: 28 mmol/L (ref 22–32)
CREATININE: 1.22 mg/dL (ref 0.61–1.24)
Calcium: 9.2 mg/dL (ref 8.9–10.3)
GFR calc non Af Amer: 59 mL/min — ABNORMAL LOW (ref >60)
Glucose, Bld: 97 mg/dL (ref 65–99)
POTASSIUM: 4.6 mmol/L (ref 3.5–5.1)
SODIUM: 140 mmol/L (ref 135–145)

## 2017-11-07 LAB — GLUCOSE, CAPILLARY: Glucose-Capillary: 114 mg/dL — ABNORMAL HIGH (ref 65–99)

## 2017-11-07 MED ORDER — ASPIRIN 81 MG PO TBEC: 81.0000 mg | DELAYED_RELEASE_TABLET | Freq: Every day | ORAL | 4 refills | Status: AC

## 2017-11-07 MED ORDER — NITROGLYCERIN 0.4 MG SL SUBL: 0.4000 mg | SUBLINGUAL_TABLET | SUBLINGUAL | 1 refills | Status: AC | PRN

## 2017-11-07 NOTE — Care Management CC44 (Signed)
Condition Code 44 Documentation Completed  Patient Details  Name: [name] MRN: [id number] Date of Birth: 08-29-1946   Condition Code 44 given:  Yes Patient signature on Condition Code 44 notice:  Yes Documentation of 2 MD's agreement:  Yes Code 44 added to claim:  Yes    Leone Havenaylor, Lillyauna Jenkinson Clinton, RN 11/07/2017, 10:33 AM

## 2017-11-07 NOTE — Progress Notes (Signed)
Progress Note  Patient Name: [name] Date of Encounter: 11/07/2017  Primary Cardiologist:  Clifton JamesMcAlhany,   Recently [name]   Subjective   71 year old gentleman with a history of coronary artery disease and coronary artery bypass grafting. He was admitted with chest pain with exertion.  Inpatient Medications    Scheduled Meds: . allopurinol  100 mg Oral Daily  . aspirin EC  81 mg Oral Daily  . atorvastatin  20 mg Oral Q supper  . insulin aspart  0-15 Units Subcutaneous TID WC  . sodium chloride flush  3 mL Intravenous Q12H   Continuous Infusions: . sodium chloride     PRN Meds: sodium chloride, acetaminophen, nitroGLYCERIN, ondansetron (ZOFRAN) IV, sodium chloride flush   Vital Signs    Vitals:   11/07/17 0200 11/07/17 0300 11/07/17 0347 11/07/17 0800  BP:   (!) 98/50 124/78  Pulse:   (!) 54 (!) 57  Resp: 20 13 14 15   Temp:   97.8 F (36.6 C) 98.2 F (36.8 C)  TempSrc:   Oral Oral  SpO2:   95% 96%  Weight:   180 lb 12.4 oz (82 kg)   Height:        Intake/Output Summary (Last 24 hours) at 11/07/2017 0917 Last data filed at 11/07/2017 0700 Gross per 24 hour  Intake 712.5 ml  Output 1000 ml  Net -287.5 ml   Filed Weights   11/06/17 1044 11/07/17 0347  Weight: 180 lb (81.6 kg) 180 lb 12.4 oz (82 kg)    Telemetry    Normal sinus rhythm- Personally Reviewed  ECG    Normal sinus rhythm- Personally Reviewed  Physical Exam   GEN: No acute distress.   Neck: No JVD Cardiac: RRR, no murmurs, rubs, or gallops.  Respiratory: Clear to auscultation bilaterally. GI: Soft, nontender, non-distended  MS: No edema; No deformity.  Catheterization site looks good. Neuro:  Nonfocal  Psych: Normal affect   Labs    Chemistry Recent Labs  Lab 11/06/17 1043 11/07/17 0334  NA 137 140  K 4.0 4.6  CL 106 107  CO2 23 28  GLUCOSE 228* 97  BUN 18 19  CREATININE 1.18 1.22  CALCIUM 9.5 9.2  GFRNONAA >60 59*  GFRAA >60 >60  ANIONGAP 8 5      Hematology Recent Labs  Lab 11/06/17 1043 11/07/17 0334  WBC 5.3 6.9  RBC 4.64 4.38  HGB 15.1 14.1  HCT 45.5 42.3  MCV 98.1 96.6  MCH 32.5 32.2  MCHC 33.2 33.3  RDW 13.4 13.2  PLT 177 164    Cardiac EnzymesNo results for input(s): TROPONINI in the last 168 hours.  Recent Labs  Lab 11/06/17 1109  TROPIPOC 0.00     BNPNo results for input(s): BNP, PROBNP in the last 168 hours.   DDimer No results for input(s): DDIMER in the last 168 hours.   Radiology    Dg Chest 2 View  Result Date: 11/06/2017 CLINICAL DATA:  Chest pain EXAM: CHEST  2 VIEW COMPARISON:  02/18/2016 FINDINGS: CABG. Heart size normal. Negative for heart failure. Lungs are clear without infiltrate effusion or mass. IMPRESSION: No active cardiopulmonary disease. Electronically Signed   By: Marlan Palauharles  Clark M.D.   On: 11/06/2017 11:13    Cardiac Studies     Patient Profile     71 y.o. male admitted with exertional chest pain  Assessment & Plan    1.  Coronary artery disease: The patient's cath revealed occlusion of the graft to the circumflex  artery.  His native circumflex artery has brisk flow.   The LIMA to the LAD is open.  The saphenous vein graft to the right coronary artery is open..  He has normal left ventricular systolic function by LV gram.  I have reminded him to keep his LDL below 70.  We have drawn fasting labs the results of which will be available later.  He can follow-up with his primary medical doctor or can follow-up with 1 of us.  He is overall done very well.  He will call us back if he has any additional problems.    For questions or updates, please contact [facility] Please consult [email or web address] for contact info under Cardiology/STEMI.      Signed, Kristeen MissPhilip Colbi Staubs, MD  11/07/2017, 9:17 AM

## 2017-11-07 NOTE — Care Management CC44 (Deleted)
Condition Code 44 Documentation Completed  Patient Details  Name: [name] MRN: [id number] Date of Birth: 01/31/1946   Condition Code 44 given:  Yes Patient signature on Condition Code 44 notice:  Yes Documentation of 2 MD's agreement:  Yes Code 44 added to claim:  Yes    Soleia Badolato Clinton, RN 11/07/2017, 10:33 AM  

## 2017-11-07 NOTE — Discharge Summary (Signed)
Discharge Summary    Patient ID: [name],  MRN: [id number], DOB/AGE: 01/24/1946 71 y.o.  Admit date: 11/06/2017 Discharge date: 11/07/2017   Primary Care Provider: Daisy Floro Primary Cardiologist: Dr. Clifton James, recently Dr. Elease Hashimoto  Discharge Diagnoses    Principal Problem:   Unstable angina [facility]) Active Problems:   Type 1 diabetes mellitus without complication (HCC)   Precordial pain   Accelerating angina (HCC)   Allergies No Known Allergies   History of Present Illness     [name] is a 71 y.o. male with a history of CAD s/p CABG, DM and HLD presented for chest pain.   [name] was in [facility] up until last week. He had done heavy yard/tree work on Thursday and Friday. While working on Friday he had severe substernal chest pressure with SOB and pain radiating to his neck. Relieved with rest. Episode again happed while workup on Saturday. Since then he has intermittent episodes. Fatigue and tired with dizziness. This morning he had chest tightness with breakfast and came to ER for further evaluation. Did not used nitro. Pain is similar to prior angina. Currently chest pain free.  Electrolytes and Scr are normal. POC troponin negative. CXR clear.   Cardiac catheterization done by Dr. Anne Fu on 04/16/2012: Left main: Minor luminal irregularities of the left main artery, branches into the LAD and circumflex.  Left anterior descending (LAD): Proximal LAD stenosis of 90%. 2 small sized diagonal vessels. The remainder of the LAD has minor luminal irregularities. The 90% stenosis proximally is at the bifurcation of the first septal branch and diagonal branch.  Circumflex artery (CIRC): The AV groove circumflex is 100% occluded in the midsegment with left to right collaterals retrograde filling the vessel. The second obtuse marginal branch is small in caliber, sluggish flow. The first, large obtuse marginal branch has minor luminal irregularities and encompasses a  large portion of the lateral wall distribution.  Right coronary artery (RCA): Proximal 95% stenosis as well as distal 60% stenosis before the bifurcation of the PDA. This is the dominant vessel.   LEFT VENTRICULOGRAM:Left ventricular angiogram was done in the 30 RAO projection and revealed normal left ventricular wall motion and systolic function with an estimated ejection fraction of 55%.   S/P CORONARY ARTERY BYPASS GRAFTING (CABG) x 3 (LIMA-LAD, SVG-dCx, SVG- PD), EVH right leg by Dr. Laneta Simmers on 04/19/2012.  No cardiology follow up since then. He goes to [facility] 6 times/week and does exercise for approximately 90 minutes without angina.   Hospital Course     Consultants: none  Given his accelerating anginal symptoms, he was taken for cardiac catheterization on 11/06/17. LIMA to LAD was patent and fills the mid and distal vessel. The circumflex has mild proximal disease with antegrade flow and the SVG to Cx is occluded. SVG to RCA is patent and fills the mid and distal vessel and distal branches. Left ventricular systolic function was normal. Continued medical therapy was recommended.   His 325 mg ASA was changed to 81 mg ASA. He is currently not on a beta blocker due to HR in the 50s. Pressures have been labile, he was instructed to keep a BP log at home to bring to his appts.  HLD Lipid profile with LDL of 45, HDL 42, and triglycerides 57. Continue current lipitor 20 mg daily.   DM Needs repeat A1c at OP follow up. Continue metformin, resume 48 hr after heart cath.   He needs cardiology follow up since he  has not seen cards since CABG in 2013.   Patient seen and examined by Dr. Elease HashimotoNahser today and was stable for discharge. All follow up has been arranged.  _____________  Discharge Vitals Blood pressure 124/78, pulse (!) 57, temperature 98.2 F (36.8 C), temperature source Oral, resp. rate 15, height 5\' 8"  (1.727 m), weight 180 lb 12.4 oz (82 kg), SpO2 96 %.  Filed Weights    11/06/17 1044 11/07/17 0347  Weight: 180 lb (81.6 kg) 180 lb 12.4 oz (82 kg)    Labs & Radiologic Studies    CBC Recent Labs    11/06/17 1043 11/07/17 0334  WBC 5.3 6.9  HGB 15.1 14.1  HCT 45.5 42.3  MCV 98.1 96.6  PLT 177 164   Basic Metabolic Panel Recent Labs    16/09/9610/27/18 1043 11/07/17 0334  NA 137 140  K 4.0 4.6  CL 106 107  CO2 23 28  GLUCOSE 228* 97  BUN 18 19  CREATININE 1.18 1.22  CALCIUM 9.5 9.2   Liver Function Tests No results for input(s): AST, ALT, ALKPHOS, BILITOT, PROT, ALBUMIN in the last 72 hours. No results for input(s): LIPASE, AMYLASE in the last 72 hours. Cardiac Enzymes No results for input(s): CKTOTAL, CKMB, CKMBINDEX, TROPONINI in the last 72 hours. BNP Invalid input(s): POCBNP D-Dimer No results for input(s): DDIMER in the last 72 hours. Hemoglobin A1C No results for input(s): HGBA1C in the last 72 hours. Fasting Lipid Panel Recent Labs    11/07/17 0334  CHOL 98  HDL 42  LDLCALC 45  TRIG 57  CHOLHDL 2.3   Thyroid Function Tests No results for input(s): TSH, T4TOTAL, T3FREE, THYROIDAB in the last 72 hours.  Invalid input(s): FREET3 _____________  Dg Chest 2 View  Result Date: 11/06/2017 CLINICAL DATA:  Chest pain EXAM: CHEST  2 VIEW COMPARISON:  02/18/2016 FINDINGS: CABG. Heart size normal. Negative for heart failure. Lungs are clear without infiltrate effusion or mass. IMPRESSION: No active cardiopulmonary disease. Electronically Signed   By: Marlan Palauharles  Clark M.D.   On: 11/06/2017 11:13     Diagnostic Studies/Procedures    Heart Cath 11/06/17:  Prox RCA lesion is 100% stenosed.  Ost LM to Mid LM lesion is 20% stenosed.  Ost Cx to Prox Cx lesion is 30% stenosed.  Ost LAD to Prox LAD lesion is 100% stenosed.  Origin lesion is 100% stenosed.  The left ventricular systolic function is normal.  LV end diastolic pressure is normal.  The left ventricular ejection fraction is 55-65% by visual estimate.  There is no  mitral valve regurgitation.   1. Severe double vessel CAD s/p 3V CABG with 2/3 patent bypass grafts.  2. The proximal LAD is chronically occluded. The LIMA graft to the LAD is patent and fills the mid and distal vessel.  3. The circumflex has mild proximal vessel disease. The vein graft to the distal Circumflex is occluded but this vessel has good antegrade flow.  4. The proximal RCA is chronically occluded  The vein graft to the distal RCA is patent and fills the mid and distal vessel and the distal branches.  5. Left ventricular systolic function is normal.  Recommendations: Continue medical management of CAD.    Disposition   [name] is being discharged home today in good condition.  Follow-up Plans & Appointments   Office will call with appt  Discharge Instructions    Diet - low sodium heart healthy   Complete by:  As directed    Discharge instructions  Complete by:  As directed    No driving for 48 hr. No lifting over 5 lbs for 1 week. No sexual activity for 1 week. You may return to work in 1 week. Keep procedure site clean & dry. If you notice increased pain, swelling, bleeding or pus, call/return!  You may shower, but no soaking baths/hot tubs/pools for 1 week.   Increase activity slowly   Complete by:  As directed       Discharge Medications   Current Discharge Medication List    START taking these medications   Details  nitroGLYCERIN (NITROSTAT) 0.4 MG SL tablet Place 1 tablet (0.4 mg total) under the tongue every 5 (five) minutes x 3 doses as needed for chest pain. Qty: 25 tablet, Refills: 1      CONTINUE these medications which have CHANGED   Details  aspirin EC 81 MG EC tablet Take 1 tablet (81 mg total) by mouth daily. Qty: 90 tablet, Refills: 4    metFORMIN (GLUCOPHAGE-XR) 500 MG 24 hr tablet Take 1 tablet (500 mg total) by mouth 2 (two) times daily with a meal. Resume 48 hr after heart catheterization.      CONTINUE these medications which have NOT CHANGED    Details  allopurinol (ZYLOPRIM) 100 MG tablet Take 100 mg by mouth daily.     atorvastatin (LIPITOR) 20 MG tablet Take 20 mg by mouth daily with supper.           Outstanding Labs/Studies   Repeat A1c at PCP  Duration of Discharge Encounter   Greater than 30 minutes including physician time.  Signed, Roe Rutherfordngela Nicole Duke PA-C 11/07/2017, 10:51 AM   Attending Note:   The patient was seen and examined.  Agree with assessment and plan as noted above.  Changes made to the above note as needed.  Patient seen and independently examined with Bettina GaviaAngie Duke, PA .   We discussed all aspects of the encounter. I agree with the assessment and plan as stated above.  1. CAD :   [name] was admitted with anginal-like symptoms.  He has been previously healthy and exercises on a regular basis.  Heart catheterization revealed an occluded graft to his left circumflex artery.  His native circumflex artery had a proximal 30% stenosis.  The patient ruled out for myocardial infarction.  He appears to be quite stable.  He will follow-up with us in the office and will continue to follow up with his primary medical doctor.   I have spent a total of 40 minutes with patient reviewing hospital  notes , telemetry, EKGs, labs and examining patient as well as establishing an assessment and plan that was discussed with the patient. > 50% of time was spent in direct patient care.    Vesta MixerPhilip J. Nahser, Montez HagemanJr., MD, [facility] CenterFACC 11/08/2017, 5:57 PM [address],  [address] Office [phone] Pager [phone]

## 2017-11-07 NOTE — Care Management Obs Status (Signed)
MEDICARE OBSERVATION STATUS NOTIFICATION   Patient Details  Name: [name] MRN: [id number] Date of Birth: 1946-01-01   Medicare Observation Status Notification Given:  Yes    Leone Havenaylor, Talecia Sherlin Clinton, RN 11/07/2017, 9:53 AM

## 2017-11-07 NOTE — Care Management Note (Signed)
Case Management Note  Patient Details  Name: [name] MRN: [id number] Date of Birth: 10-Apr-1946  Subjective/Objective:  From home with wife, s/p left heart cath/angiography, no intervention.                    Action/Plan: DC home , no needs.  Expected Discharge Date:                  Expected Discharge Plan:  Home/Self Care  In-House Referral:     Discharge planning Services  CM Consult  Post Acute Care Choice:    Choice offered to:     DME Arranged:    DME Agency:     HH Arranged:    HH Agency:     Status of Service:  Completed, signed off  If discussed at [employer] Length of Stay Meetings, dates discussed:    Additional Comments:  Leone Havenaylor, Panhia Karl Clinton, RN 11/07/2017, 10:02 AM

## 2017-11-13 ENCOUNTER — Ambulatory Visit: Payer: Medicare PPO | Admitting: Internal Medicine

## 2017-12-06 ENCOUNTER — Encounter: Payer: Self-pay | Admitting: Cardiovascular Disease

## 2018-02-12 IMAGING — DX DG CHEST 2V
2 series · 2 of 2 positions shown · non-contrast
Comparison: 02/18/2016

CLINICAL DATA: Chest pain

EXAM:
CHEST  2 VIEW

[chest pa]
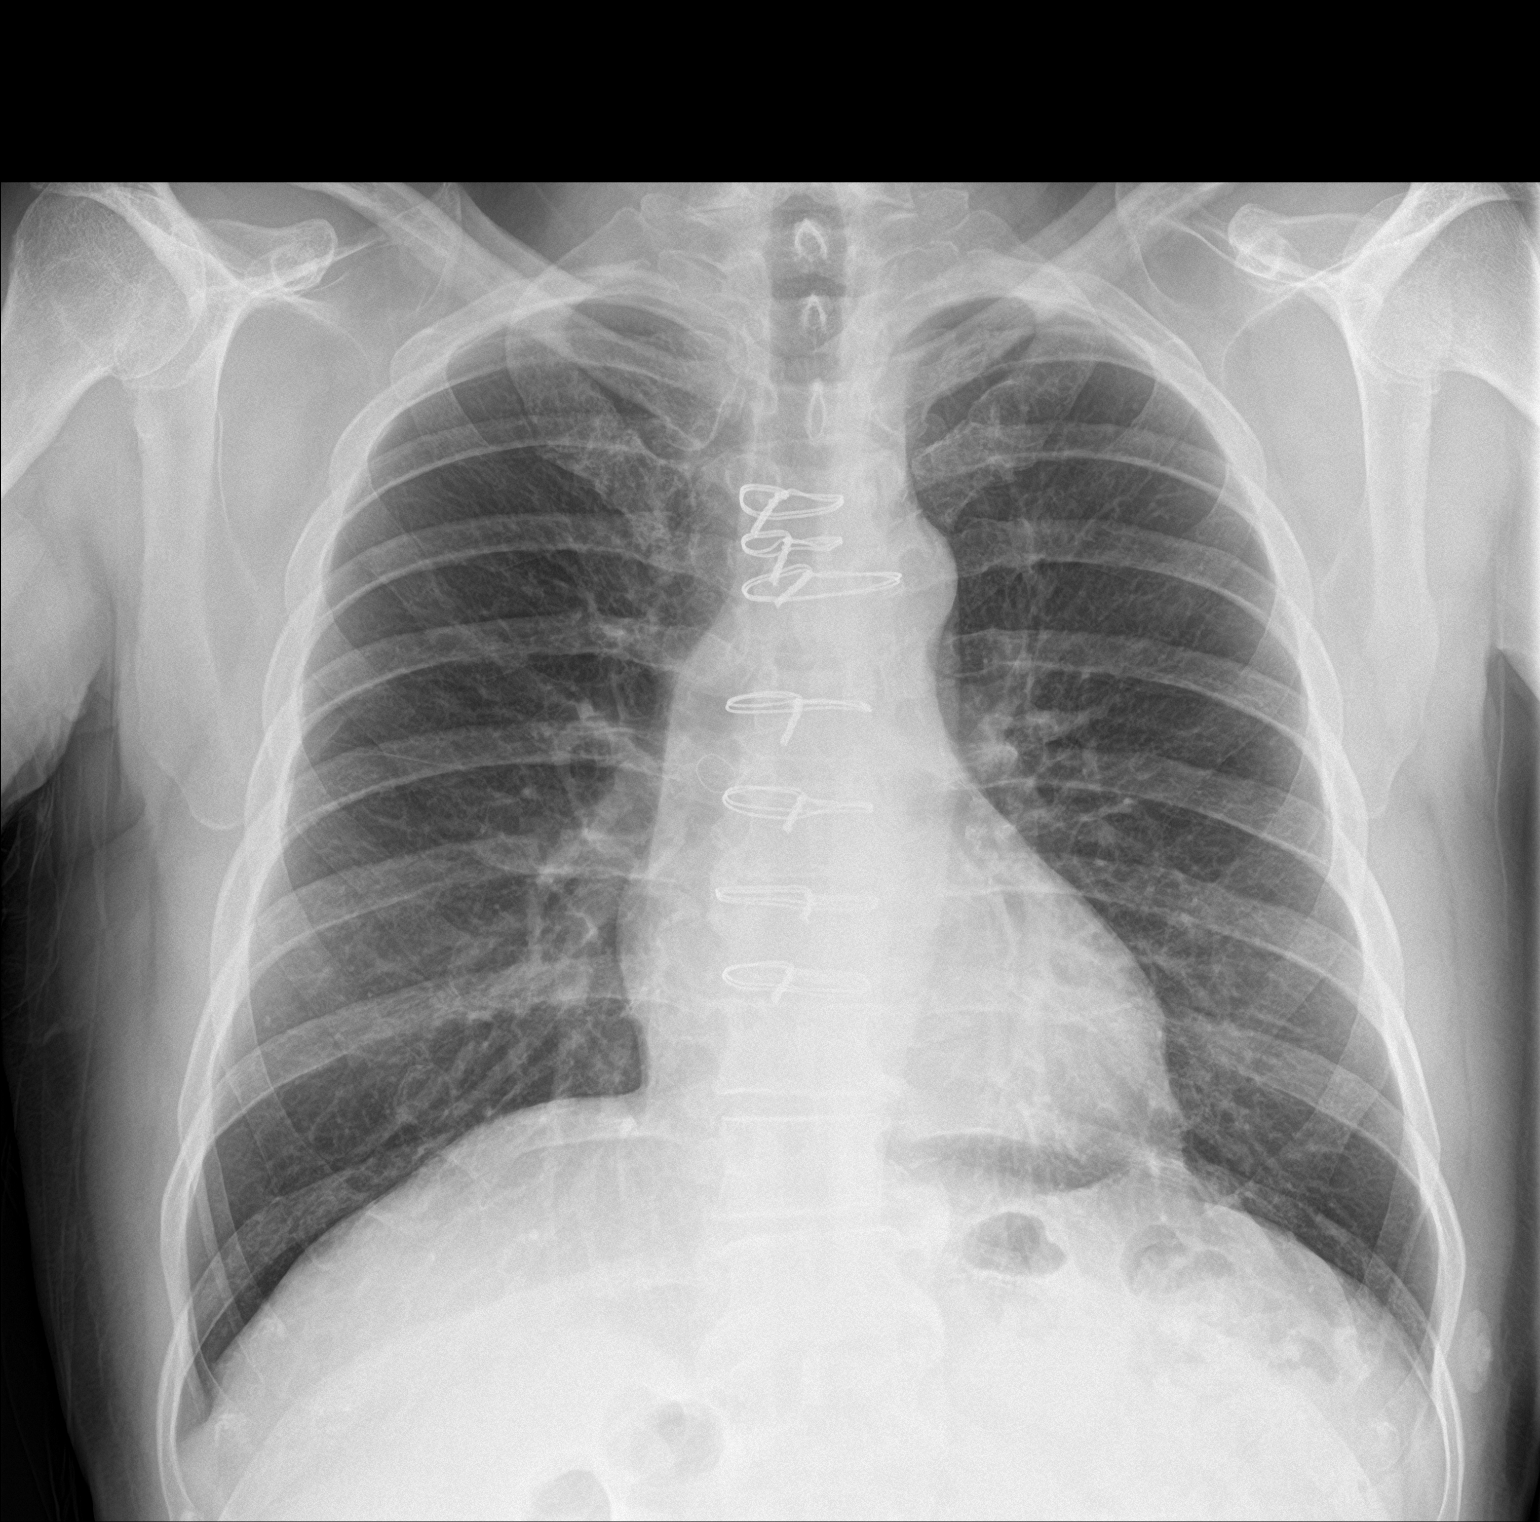

[chest lat]
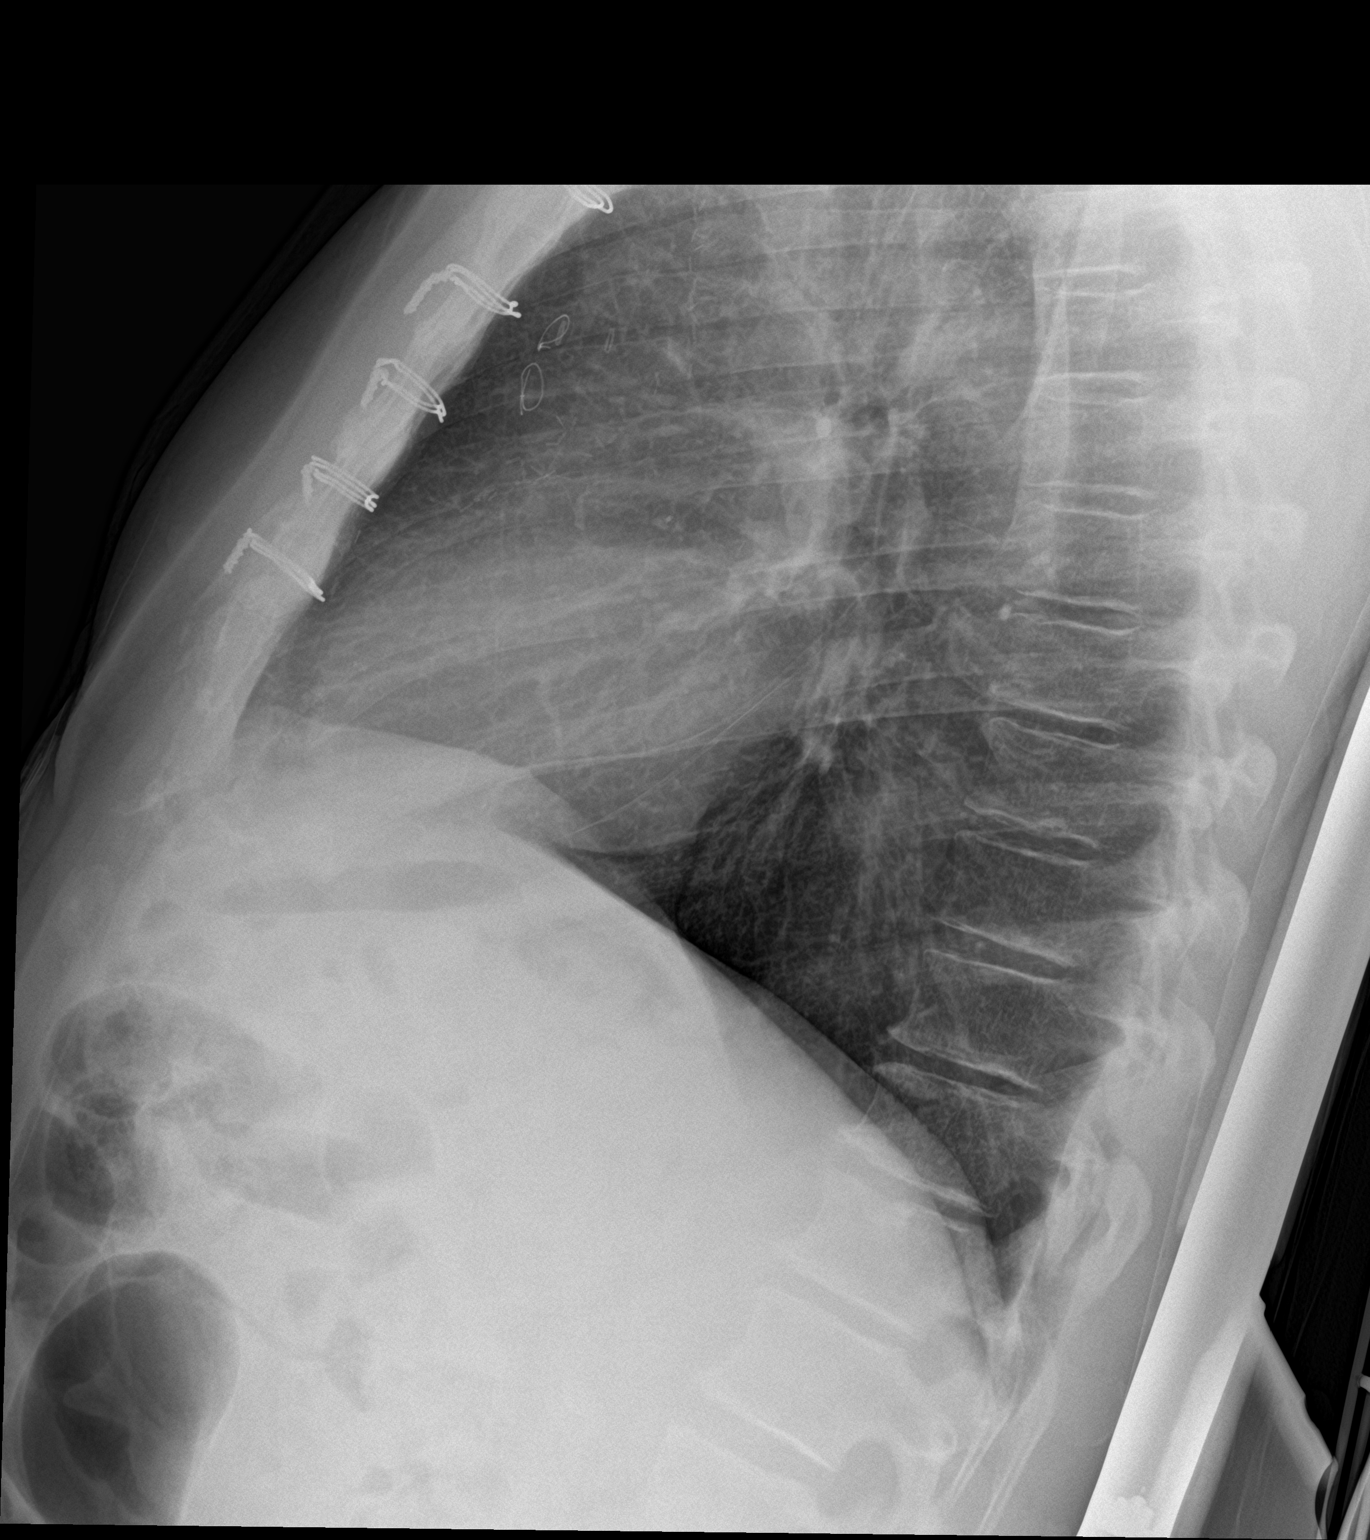

[2 of 2 positions shown; findings below may reference images not displayed]

FINDINGS: CABG. Heart size normal. Negative for heart failure. Lungs are clear
without infiltrate effusion or mass.
IMPRESSION: No active cardiopulmonary disease.

## 2019-02-05 ENCOUNTER — Ambulatory Visit: Payer: Medicare Other | Admitting: Neurology

## 2019-02-05 ENCOUNTER — Encounter: Payer: Self-pay | Admitting: Neurology

## 2019-02-05 VITALS — BP 139/82 | HR 70 | Ht 68.0 in | Wt 184.0 lb

## 2019-02-05 DIAGNOSIS — R202 Paresthesia of skin: Secondary | ICD-10-CM | POA: Diagnosis not present

## 2019-02-05 MED ORDER — GABAPENTIN 300 MG PO CAPS: 300.0000 mg | ORAL_CAPSULE | Freq: Every day | ORAL | 3 refills | Status: DC

## 2019-02-05 NOTE — Progress Notes (Signed)
Reason for visit: Burning feet  Referring physician: Dr. [name] is a 73 y.o. male  History of present illness:  [name] is a 73 year old right-handed white male with a history of diabetes, the last hemoglobin A1c was 6.7.  The patient has begun having burning sensations in the feet that began only about 2 months ago.  The patient indicates that the symptoms are not prominent when he is up and active, he mainly notes that in the evening when he is watching TV or when he tries to get to sleep at night that he has significant difficulty with burning dysesthesias in the bottom of the feet mainly.  He may sometimes note slight numbness above the ankles in both legs, and sometimes also in the fingers of the hands.  He denies neck or low back pain or pain down the arms or legs.  He denies any significant weakness or balance changes, he has not had trouble controlling the bowels or the bladder.  At times, he may feel somewhat restless with his legs.  He was seen by his primary care physician who referred him to this office for further investigation.  Past Medical History:  Diagnosis Date  . Clostridium difficile infection   . Coronary artery disease    CARDIOLOGIST--  dr Anne Fu  . Gout   . History of kidney stones   . History of skin cancer    42 moles frozen off   . Left ureteral calculus   . Neuropathy   . S/P CABG x 3    04-19-2012  . Type 2 diabetes mellitus (HCC)   . Urgency of urination   . Wears partial dentures    upper and lower    Past Surgical History:  Procedure Laterality Date  . CARDIAC CATHETERIZATION  04/16/2012 dr Anne Fu   severe 3 vessel CAD/  pLAD 90%,  pRCA 95%, total occluded CFX/  normal LVSF/  diastolic dysfunction  . CARDIAC CATHETERIZATION  11/06/2017  . CARDIOVASCULAR STRESS TEST  04/2012  . COLONOSCOPY WITH PROPOFOL N/A 08/23/2017   Procedure: COLONOSCOPY WITH PROPOFOL;  Surgeon: Cindi Ghazarian Modena, MD;  Location: [facility];  Service:  Endoscopy;  Laterality: N/A;  . CORONARY ARTERY BYPASS GRAFT  04/19/2012   Procedure: CORONARY ARTERY BYPASS GRAFTING (CABG);  Surgeon: Alleen Borne, MD;  Location[facility] OR;  Service: Open Heart Surgery;  Laterality: N/A; LIMA to LAD, SVG to CFX, SVG to PDA  . CYSTOLITHAPAXY W/ HOLMIUM LASER AND TRANSURETHRAL INCISION OF PROSTATE  10-23-2006  . CYSTOSCOPY WITH RETROGRADE PYELOGRAM, URETEROSCOPY AND STENT PLACEMENT Left 11/02/2014   Procedure: CYSTOSCOPY WITH RETROGRADE PYELOGRAM, URETEROSCOPY, EXTRACTION OF STONES AND STENT PLACEMENT;  Surgeon: [name], MD;  Location: [facility];  Service: Urology;  Laterality: Left;  . CYSTOSCOPY WITH URETEROSCOPY, STONE BASKETRY AND STENT PLACEMENT Right 12/01/2013   Procedure: CYSTOSCOPY WITH URETEROSCOPY, RIGHT RETROGRADE PYLEGRAM ,  STONE REMOVAL,  AND STENT PLACEMENT;  Surgeon: Marcine Matar, MD;  Location: [facility] ORS;  Service: Urology;  Laterality: Right;  . FECAL TRANSPLANT N/A 08/23/2017   Procedure: FECAL TRANSPLANT;  Surgeon: Marcianne Ozbun Modena, MD;  Location: [facility] ENDOSCOPY;  Service: Endoscopy;  Laterality: N/A;  . HOLMIUM LASER APPLICATION Right 12/01/2013   Procedure: HOLMIUM LASER APPLICATION;  Surgeon: Marcine Matar, MD;  Location: [facility] ORS;  Service: Urology;  Laterality: Right;  . HOLMIUM LASER APPLICATION Left 11/02/2014   Procedure: HOLMIUM LASER APPLICATION;  Surgeon: [name], MD;  Location: [facility];  Service: Urology;  Laterality: Left;  . INCISION AND DRAINAGE ABSCESS N/A 12/25/2016   Procedure: INCISION AND DRAINAGE SUBMENTAL ABSCESS;  Surgeon: Christia Reading, MD;  Location: [facility] OR;  Service: ENT;  Laterality: N/A;  . KNEE ARTHROSCOPY Left 1994  . LEFT HEART CATH AND CORS/GRAFTS ANGIOGRAPHY N/A 11/06/2017   Procedure: LEFT HEART CATH AND CORS/GRAFTS ANGIOGRAPHY;  Surgeon: Kathleene Hazel, MD;  Location: [facility] INVASIVE CV LAB;  Service: Cardiovascular;  Laterality: N/A;  . LEFT HEART  CATHETERIZATION WITH CORONARY ANGIOGRAM N/A 04/16/2012   Procedure: LEFT HEART CATHETERIZATION WITH CORONARY ANGIOGRAM;  Surgeon: Donato Schultz, MD;  Location[facility] CATH LAB;  Service: Cardiovascular;  Laterality: N/A;  . SKIN CANCER EXCISION  2014  . URETEROSCOPIC STONE EXTRACTION W/ STENT PLACEMENT Bilateral bilateral 06-23-2011//  right 09-16-2007//  left 07-02-2009    Family History  Problem Relation Age of Onset  . Congestive Heart Failure Mother   . Alcohol abuse Father   . Kidney Stones Other   . Heart failure Other   . Diabetes Other   . Heart attack Maternal Uncle        multiple mat. uncles in their 33's     Social history:  reports that he has never smoked. He has never used smokeless tobacco. He reports current alcohol use. He reports that he does not use drugs.  Medications:  Prior to Admission medications   Medication Sig Start Date End Date Taking? Authorizing Provider  allopurinol (ZYLOPRIM) 100 MG tablet Take 100 mg by mouth daily.    Yes [provider]  aspirin EC 81 MG EC tablet Take 1 tablet (81 mg total) by mouth daily. 11/08/17  Yes Duke, Roe Rutherford, PA  atorvastatin (LIPITOR) 20 MG tablet Take 20 mg by mouth daily with supper.    Yes [provider]  metFORMIN (GLUCOPHAGE-XR) 500 MG 24 hr tablet Take 1 tablet (500 mg total) by mouth 2 (two) times daily with a meal. Resume 48 hr after heart catheterization. 11/07/17  Yes Duke, Roe Rutherford, PA  nitroGLYCERIN (NITROSTAT) 0.4 MG SL tablet Place 1 tablet (0.4 mg total) under the tongue every 5 (five) minutes x 3 doses as needed for chest pain. Patient not taking: Reported on 02/05/2019 11/07/17   Marcelino Duster, PA      Allergies  Allergen Reactions  . Clindamycin/Lincomycin     C-Diff     ROS:  Out of a complete 14 system review of symptoms, the patient complains only of the following symptoms, and all other reviewed systems are negative.  Numbness  Blood pressure 139/82, pulse 70,  height 5\' 8"  (1.727 m), weight 184 lb (83.5 kg).  Physical Exam  General: The patient is alert and cooperative at the time of the examination.  Eyes: Pupils are equal, round, and reactive to light. Discs are flat bilaterally.  Neck: The neck is supple, no carotid bruits are noted.  Respiratory: The respiratory examination is clear.  Cardiovascular: The cardiovascular examination reveals a regular rate and rhythm, no obvious murmurs or rubs are noted.  Skin: Extremities are without significant edema.  Neurologic Exam  Mental status: The patient is alert and oriented x 3 at the time of the examination. The patient has apparent normal recent and remote memory, with an apparently normal attention span and concentration ability.  Cranial nerves: Facial symmetry is present. There is good sensation of the face to pinprick and soft touch bilaterally. The strength of the facial muscles and the muscles to head  turning and shoulder shrug are normal bilaterally. Speech is well enunciated, no aphasia or dysarthria is noted. Extraocular movements are full. Visual fields are full. The tongue is midline, and the patient has symmetric elevation of the soft palate. No obvious hearing deficits are noted.  Motor: The motor testing reveals 5 over 5 strength of all 4 extremities. Good symmetric motor tone is noted throughout.  Sensory: Sensory testing is intact to pinprick, soft touch, vibration sensation, and position sense on all 4 extremities.  A stocking pattern pinprick sensory deficit was not noted.  No evidence of extinction is noted.  Coordination: Cerebellar testing reveals good finger-nose-finger and heel-to-shin bilaterally.  Gait and station: Gait is normal. Tandem gait is normal. Romberg is negative. No drift is seen.  Reflexes: Deep tendon reflexes are symmetric, but are depressed bilaterally. Toes are downgoing bilaterally.   Assessment/Plan:  1.  Possible diabetic peripheral  neuropathy  The patient does have symptoms typical for a neuropathy.  He is having difficulty sleeping at night because of the discomfort, we will start gabapentin 300 mg in the evening.  The patient will be set up for further blood work today.  Nerve conduction studies will be done on both legs and EMG will be done on one leg.  The patient will follow-up otherwise in about 4 months.  Marlan Palau MD 02/05/2019 8:29 AM  [facility] [address] [place], [place] [address]  Phone [phone] Fax [phone]

## 2019-02-10 LAB — ANA W/REFLEX: ANA: NEGATIVE

## 2019-02-10 LAB — MULTIPLE MYELOMA PANEL, SERUM
ALBUMIN SERPL ELPH-MCNC: 4 g/dL (ref 2.9–4.4)
ALBUMIN/GLOB SERPL: 1.7 (ref 0.7–1.7)
Alpha 1: 0.2 g/dL (ref 0.0–0.4)
Alpha2 Glob SerPl Elph-Mcnc: 0.6 g/dL (ref 0.4–1.0)
B-GLOBULIN SERPL ELPH-MCNC: 0.9 g/dL (ref 0.7–1.3)
GLOBULIN, TOTAL: 2.5 g/dL (ref 2.2–3.9)
Gamma Glob SerPl Elph-Mcnc: 0.7 g/dL (ref 0.4–1.8)
IgA/Immunoglobulin A, Serum: 171 mg/dL (ref 61–437)
IgG (Immunoglobin G), Serum: 785 mg/dL (ref 700–1600)
IgM (Immunoglobulin M), Srm: 92 mg/dL (ref 20–172)
Total Protein: 6.5 g/dL (ref 6.0–8.5)

## 2019-02-10 LAB — ANGIOTENSIN CONVERTING ENZYME: Angio Convert Enzyme: 47 U/L (ref 14–82)

## 2019-02-10 LAB — B. BURGDORFI ANTIBODIES: Lyme IgG/IgM Ab: <0.91 {ISR} (ref 0.00–0.90)

## 2019-02-10 LAB — RHEUMATOID FACTOR: Rhuematoid fact SerPl-aCnc: 10.5 IU/mL (ref 0.0–13.9)

## 2019-03-03 ENCOUNTER — Telehealth: Payer: Self-pay

## 2019-03-03 MED ORDER — GABAPENTIN 300 MG PO CAPS: 300.0000 mg | ORAL_CAPSULE | Freq: Every day | ORAL | 3 refills | Status: DC

## 2019-03-03 NOTE — Telephone Encounter (Signed)
I called patient to cancel his NCV/EMG for tomorrow. He says that the gabapentin has helped a little, but he is still having symptoms and wondered if you would like for him to increase the medication for now?

## 2019-03-03 NOTE — Addendum Note (Signed)
Addended by: York Spaniel on: 03/03/2019 11:58 AM   Modules accepted: Orders

## 2019-03-03 NOTE — Telephone Encounter (Signed)
I called the patient.  The patient is getting some benefit from the 300 mg dosing at night of the gabapentin, he is not getting complete relief, he will go up to the 600 mg at night of the gabapentin.  I will call in a prescription to accommodate this dose increase.

## 2019-03-04 ENCOUNTER — Encounter: Payer: Medicare Other | Admitting: Neurology

## 2019-03-27 ENCOUNTER — Ambulatory Visit: Payer: Medicare Other | Admitting: Neurology

## 2019-04-30 ENCOUNTER — Encounter: Payer: Self-pay | Admitting: Neurology

## 2019-04-30 ENCOUNTER — Ambulatory Visit (INDEPENDENT_AMBULATORY_CARE_PROVIDER_SITE_OTHER): Payer: Medicare Other | Admitting: Neurology

## 2019-04-30 ENCOUNTER — Other Ambulatory Visit: Payer: Self-pay

## 2019-04-30 DIAGNOSIS — R202 Paresthesia of skin: Secondary | ICD-10-CM

## 2019-04-30 DIAGNOSIS — E1142 Type 2 diabetes mellitus with diabetic polyneuropathy: Secondary | ICD-10-CM | POA: Insufficient documentation

## 2019-04-30 HISTORY — DX: Type 2 diabetes mellitus with diabetic polyneuropathy: E11.42

## 2019-04-30 NOTE — Progress Notes (Signed)
Please refer to EMG and nerve conduction procedure note.  

## 2019-04-30 NOTE — Procedures (Signed)
     HISTORY:  [name] is a 73 year old gentleman with diabetes who reports some burning and stinging sensations in the feet.  The patient comes in today for an evaluation for possible peripheral neuropathy.  NERVE CONDUCTION STUDIES:  Nerve conduction studies were performed on both lower extremities.  The distal motor latencies for the peroneal and posterior tibial nerves were within normal limits bilaterally.  The motor amplitudes for the posterior tibial nerves were low bilaterally but were normal for the peroneal nerves bilaterally.  Slowing was seen for the peroneal and posterior tibial nerves bilaterally.  The sural sensory latencies were unobtainable on the right, prolonged on the left and prolonged for the peroneal nerves bilaterally.  The F-wave latencies for the posterior tibial nerves were prolonged on the right, normal on the left.  EMG STUDIES:  EMG study was performed on the right lower extremity:  The tibialis anterior muscle reveals 2 to 4K motor units with decreased recruitment. No fibrillations or positive waves were seen. The peroneus tertius muscle reveals 2 to 5K motor units with decreased recruitment. No fibrillations or positive waves were seen. The medial gastrocnemius muscle reveals 1 to 3K motor units with slightly decreased recruitment. No fibrillations or positive waves were seen. The vastus lateralis muscle reveals 2 to 4K motor units with full recruitment. No fibrillations or positive waves were seen. The iliopsoas muscle reveals 2 to 4K motor units with full recruitment. No fibrillations or positive waves were seen. The biceps femoris muscle (long head) reveals 2 to 4K motor units with full recruitment. No fibrillations or positive waves were seen. The lumbosacral paraspinal muscles were tested at 3 levels, and revealed no abnormalities of insertional activity at all 3 levels tested. There was good relaxation.   IMPRESSION:  Nerve conduction studies done  on both lower extremities shows evidence of a primarily axonal peripheral neuropathy, likely secondary to diabetes.  The peripheral neuropathy is of mild to moderate severity.  EMG evaluation of the right lower extremity shows chronic stable distal signs of neuropathic denervation consistent with a diagnosis of peripheral neuropathy.  There is no clear evidence of an overlying lumbosacral radiculopathy.  Marlan Palau MD 04/30/2019 4:40 PM  [facility] [address] [place], [place] [address]  Phone [phone] Fax [phone]

## 2019-04-30 NOTE — Progress Notes (Addendum)
The patient comes in today for EMG nerve conduction study evaluation.  Nerve conduction studies do show evidence of a primarily axonal peripheral neuropathy likely secondary to diabetes, with mild to moderate severity.  The patient has gained some benefit with gabapentin taking 600 mg dosing.  His diabetes in general has been under good control.       MNC    Nerve / Sites Muscle Latency Ref. Amplitude Ref. Rel Amp Segments Distance Velocity Ref. Area    ms ms mV mV %  cm m/s m/s mVms  L Peroneal - EDB     Ankle EDB 5.5 ?6.5 4.3 ?2.0 100 Ankle - EDB 9   12.0     Fib head EDB 12.6  3.6  83.6 Fib head - Ankle 28 40 ?44 11.1     Pop fossa EDB 15.1  3.5  97.3 Pop fossa - Fib head 10 40 ?44 11.0         Pop fossa - Ankle      R Peroneal - EDB     Ankle EDB 6.3 ?6.5 3.6 ?2.0 100 Ankle - EDB 9   11.8     Fib head EDB 14.2  3.2  87.4 Fib head - Ankle 30 38 ?44 12.0     Pop fossa EDB 16.8  3.4  107 Pop fossa - Fib head 10 38 ?44 13.0         Pop fossa - Ankle      L Tibial - AH     Ankle AH 4.4 ?5.8 1.9 ?4.0 100 Ankle - AH 9   6.0     Pop fossa AH 15.2  1.2  61.8 Pop fossa - Ankle 38 35 ?41 6.1  R Tibial - AH     Ankle AH 4.2 ?5.8 2.6 ?4.0 100 Ankle - AH 9   7.2     Pop fossa AH 15.9  0.6  21.7 Pop fossa - Ankle 36 31 ?41 2.7             SNC    Nerve / Sites Rec. Site Peak Lat Ref.  Amp Ref. Segments Distance    ms ms V V  cm  L Sural - Ankle (Calf)     Calf Ankle 4.7 ?4.4 2 ?6 Calf - Ankle 14  R Sural - Ankle (Calf)     Calf Ankle NR ?4.4 NR ?6 Calf - Ankle 14  L Superficial peroneal - Ankle     Lat leg Ankle 4.8 ?4.4 4 ?6 Lat leg - Ankle 14  R Superficial peroneal - Ankle     Lat leg Ankle 5.2 ?4.4 1 ?6 Lat leg - Ankle 14              F  Wave    Nerve F Lat Ref.   ms ms  L Tibial - AH 52.0 ?56.0  R Tibial - AH 58.0 ?56.0

## 2019-06-06 ENCOUNTER — Ambulatory Visit: Payer: Medicare Other | Admitting: Neurology

## 2019-06-11 NOTE — Progress Notes (Signed)
PATIENT: [name] DaughtersWilliam [name] DOB: Nov 05, 1946  REASON FOR VISIT: follow up HISTORY FROM: patient  HISTORY OF PRESENT ILLNESS: Today 06/12/19  [name] is a 73 year old male with history of diabetes and peripheral neuropathy.  She had EMG nerve conduction study in May 2020 shows evidence of a primarily axonal peripheral neuropathy likely secondary to diabetes with mild to moderate severity.  He indicates his diabetes is under good control. He remains on gabapentin 600 mg at bedtime.  He reports the gabapentin has helped the burning sensation in his feet at nighttime.  He reports during the day he may have burning in his feet with prolonged yardwork or car rides.  He is sleeping well. At night, he will soak his feet in cold water before bedtime. Otherwise, he is not having any problems.  He does report that he is exercising about 3 times a week, he has noticed that his legs get tired easier than he used to.  He used to exercise 6 days a week, did not feel that his legs tired is easy.  He presents today for follow-up unaccompanied.  HISTORY  [name] is a 73 year old right-handed white male with a history of diabetes, the last hemoglobin A1c was 6.7.  The patient has begun having burning sensations in the feet that began only about 2 months ago.  The patient indicates that the symptoms are not prominent when he is up and active, he mainly notes that in the evening when he is watching TV or when he tries to get to sleep at night that he has significant difficulty with burning dysesthesias in the bottom of the feet mainly.  He may sometimes note slight numbness above the ankles in both legs, and sometimes also in the fingers of the hands.  He denies neck or low back pain or pain down the arms or legs.  He denies any significant weakness or balance changes, he has not had trouble controlling the bowels or the bladder.  At times, he may feel somewhat restless with his legs.  He was seen by his primary care  physician who referred him to this office for further investigation.  REVIEW OF SYSTEMS: Out of a complete 14 system review of symptoms, the patient complains only of the following symptoms, and all other reviewed systems are negative.  Burning in his legs  ALLERGIES: Allergies  Allergen Reactions  . Clindamycin/Lincomycin     C-Diff     HOME MEDICATIONS: Outpatient Medications Prior to Visit  Medication Sig Dispense Refill  . allopurinol (ZYLOPRIM) 100 MG tablet Take 100 mg by mouth daily.     Marland Kitchen. aspirin EC 81 MG EC tablet Take 1 tablet (81 mg total) by mouth daily. 90 tablet 4  . atorvastatin (LIPITOR) 20 MG tablet Take 20 mg by mouth daily with supper.     . gabapentin (NEURONTIN) 300 MG capsule Take 1 capsule (300 mg total) by mouth at bedtime. 60 capsule 3  . metFORMIN (GLUCOPHAGE-XR) 500 MG 24 hr tablet Take 1 tablet (500 mg total) by mouth 2 (two) times daily with a meal. Resume 48 hr after heart catheterization.    . nitroGLYCERIN (NITROSTAT) 0.4 MG SL tablet Place 1 tablet (0.4 mg total) under the tongue every 5 (five) minutes x 3 doses as needed for chest pain. 25 tablet 1   Facility-Administered Medications Prior to Visit  Medication Dose Route Frequency Provider Last Rate Last Dose  . 0.9 %  sodium chloride infusion   Intravenous Continuous  Jake BatheSkains, Mark C, MD        PAST MEDICAL HISTORY: Past Medical History:  Diagnosis Date  . Clostridium difficile infection   . Coronary artery disease    CARDIOLOGIST--  dr Anne Fuskains  . Diabetic peripheral neuropathy (HCC) 04/30/2019  . Gout   . History of kidney stones   . History of skin cancer    42 moles frozen off   . Left ureteral calculus   . Neuropathy   . S/P CABG x 3    04-19-2012  . Type 2 diabetes mellitus (HCC)   . Urgency of urination   . Wears partial dentures    upper and lower    PAST SURGICAL HISTORY: Past Surgical History:  Procedure Laterality Date  . CARDIAC CATHETERIZATION  04/16/2012 dr Anne Fuskains    severe 3 vessel CAD/  pLAD 90%,  pRCA 95%, total occluded CFX/  normal LVSF/  diastolic dysfunction  . CARDIAC CATHETERIZATION  11/06/2017  . CARDIOVASCULAR STRESS TEST  04/2012  . COLONOSCOPY WITH PROPOFOL N/A 08/23/2017   Procedure: COLONOSCOPY WITH PROPOFOL;  Surgeon: Willis Modenautlaw, Avish, MD;  [facility];  Service: Endoscopy;  Laterality: N/A;  . CORONARY ARTERY BYPASS GRAFT  04/19/2012   Procedure: CORONARY ARTERY BYPASS GRAFTING (CABG);  Surgeon: Alleen BorneBryan K Bartle, MD;  [facility];  Service: Open Heart Surgery;  Laterality: N/A; LIMA to LAD, SVG to CFX, SVG to PDA  . CYSTOLITHAPAXY W/ HOLMIUM LASER AND TRANSURETHRAL INCISION OF PROSTATE  10-23-2006  . CYSTOSCOPY WITH RETROGRADE PYELOGRAM, URETEROSCOPY AND STENT PLACEMENT Left 11/02/2014   Procedure: CYSTOSCOPY WITH RETROGRADE PYELOGRAM, URETEROSCOPY, EXTRACTION OF STONES AND STENT PLACEMENT;  Surgeon: Chelsea AusStephen M Dahlstedt, MD;  [facility];  Service: Urology;  Laterality: Left;  . CYSTOSCOPY WITH URETEROSCOPY, STONE BASKETRY AND STENT PLACEMENT Right 12/01/2013   Procedure: CYSTOSCOPY WITH URETEROSCOPY, RIGHT RETROGRADE PYLEGRAM ,  STONE REMOVAL,  AND STENT PLACEMENT;  Surgeon: Marcine MatarStephen Dahlstedt, MD;  Location: [facility]S;  Service: Urology;  Laterality: Right;  . FECAL TRANSPLANT N/A 08/23/2017   Procedure: FECAL TRANSPLANT;  Surgeon: Willis Modenautlaw, Gurjit, MD;  [facility];  Service: Endoscopy;  Laterality: N/A;  . HOLMIUM LASER APPLICATION Right 12/01/2013   Procedure: HOLMIUM LASER APPLICATION;  Surgeon: Marcine MatarStephen Dahlstedt, MD;  Location: [facility] ORS;  Service: Urology;  Laterality: Right;  . HOLMIUM LASER APPLICATION Left 11/02/2014   Procedure: HOLMIUM LASER APPLICATION;  Surgeon: Chelsea AusStephen M Dahlstedt, MD;  [facility];  Service: Urology;  Laterality: Left;  . INCISION AND DRAINAGE ABSCESS N/A 12/25/2016   Procedure: INCISION AND DRAINAGE SUBMENTAL ABSCESS;  Surgeon: Christia Readingwight Bates, MD;   [facility];  Service: ENT;  Laterality: N/A;  . KNEE ARTHROSCOPY Left 1994  . LEFT HEART CATH AND CORS/GRAFTS ANGIOGRAPHY N/A 11/06/2017   Procedure: LEFT HEART CATH AND CORS/GRAFTS ANGIOGRAPHY;  Surgeon: Kathleene HazelMcAlhany, Christopher D, MD;  Location: [facility] INVASIVE CV LAB;  Service: Cardiovascular;  Laterality: N/A;  . LEFT HEART CATHETERIZATION WITH CORONARY ANGIOGRAM N/A 04/16/2012   Procedure: LEFT HEART CATHETERIZATION WITH CORONARY ANGIOGRAM;  Surgeon: Donato SchultzMark Skains, MD;  [facility];  Service: Cardiovascular;  Laterality: N/A;  . SKIN CANCER EXCISION  2014  . URETEROSCOPIC STONE EXTRACTION W/ STENT PLACEMENT Bilateral bilateral 06-23-2011//  right 09-16-2007//  left 07-02-2009    FAMILY HISTORY: Family History  Problem Relation Age of Onset  . Congestive Heart Failure Mother   . Alcohol abuse Father   . Kidney Stones Other   . Heart failure Other   .  Diabetes Other   . Heart attack Maternal Uncle        multiple mat. uncles in their 5's     SOCIAL HISTORY: Social History   Socioeconomic History  . Marital status: Married    Spouse name: Dayle Peake   . Number of children: 3  . Years of education: Not on file  . Highest education level: Master's degree (e.g., MA, MS, MEng, MEd, MSW, MBA)  Occupational History  . Occupation: Retired Management consultant  . Financial resource strain: Not on file  . Food insecurity    Worry: Not on file    Inability: Not on file  . Transportation needs    Medical: Not on file    Non-medical: Not on file  Tobacco Use  . Smoking status: Never Smoker  . Smokeless tobacco: Never Used  Substance and Sexual Activity  . Alcohol use: Yes    Comment: occasional  . Drug use: No  . Sexual activity: Never  Lifestyle  . Physical activity    Days per week: Not on file    Minutes per session: Not on file  . Stress: Not on file  Relationships  . Social Herbalist on phone: Not on file    Gets together: Not on file    Attends  religious service: Not on file    Active member of club or organization: Not on file    Attends meetings of clubs or organizations: Not on file    Relationship status: Not on file  . Intimate partner violence    Fear of current or ex partner: Not on file    Emotionally abused: Not on file    Physically abused: Not on file    Forced sexual activity: Not on file  Other Topics Concern  . Not on file  Social History Narrative   Right handed    Caffeine 4 cups daily    Lives at home with spouse       PHYSICAL EXAM  Vitals:   06/12/19 0856  BP: 114/69  Pulse: (!) 56  Temp: 98.2 F (36.8 C)  Weight: 182 lb (82.6 kg)  Height: 5\' 8"  (1.727 m)   Body mass index is 27.67 kg/m.  Generalized: Well developed, in no acute distress   Neurological examination  Mentation: Alert oriented to time, place, history taking. Follows all commands speech and language fluent Cranial nerve II-XII: Pupils were equal round reactive to light. Extraocular movements were full, visual field were full on confrontational test. Facial sensation and strength were normal. Uvula tongue midline. Head turning and shoulder shrug  were normal and symmetric. Motor: The motor testing reveals 5 over 5 strength of all 4 extremities. Good symmetric motor tone is noted throughout.  Sensory: Sensory testing is intact to soft touch, pinprick and vibration on all 4 extremities. No evidence of extinction is noted.  Coordination: Cerebellar testing reveals good finger-nose-finger and heel-to-shin bilaterally.  Gait and station: Gait is normal. Tandem gait is normal. Romberg is negative. No drift is seen.  Reflexes: Deep tendon reflexes are symmetric and normal bilaterally.   DIAGNOSTIC DATA (LABS, IMAGING, TESTING) - I reviewed patient records, labs, notes, testing and imaging myself where available.  Lab Results  Component Value Date   WBC 6.9 11/07/2017   HGB 14.1 11/07/2017   HCT 42.3 11/07/2017   MCV 96.6 11/07/2017    PLT 164 11/07/2017      Component Value Date/Time   NA 140  11/07/2017 0334   K 4.6 11/07/2017 0334   CL 107 11/07/2017 0334   CO2 28 11/07/2017 0334   GLUCOSE 97 11/07/2017 0334   BUN 19 11/07/2017 0334   CREATININE 1.22 11/07/2017 0334   CALCIUM 9.2 11/07/2017 0334   PROT 6.5 02/05/2019 0849   ALBUMIN 3.2 (L) 12/25/2016 1545   AST 27 12/25/2016 1545   ALT 18 12/25/2016 1545   ALKPHOS 52 12/25/2016 1545   BILITOT 0.8 12/25/2016 1545   GFRNONAA 59 (L) 11/07/2017 0334   GFRAA >60 11/07/2017 0334   Lab Results  Component Value Date   CHOL 98 11/07/2017   HDL 42 11/07/2017   LDLCALC 45 11/07/2017   TRIG 57 11/07/2017   CHOLHDL 2.3 11/07/2017   Lab Results  Component Value Date   HGBA1C 7.0 (H) 04/17/2012   No results found for: VITAMINB12 No results found for: TSH    ASSESSMENT AND PLAN 73 y.o. year old male  has a past medical history of Clostridium difficile infection, Coronary artery disease, Diabetic peripheral neuropathy (HCC) (04/30/2019), Gout, History of kidney stones, History of skin cancer, Left ureteral calculus, Neuropathy, S/P CABG x 3, Type 2 diabetes mellitus (HCC), Urgency of urination, and Wears partial dentures. here with:  1.  Peripheral neuropathy  He has done quite well. The gabapentin has been beneficial for the burning in his legs at night.  He will continue taking 600 mg gabapentin at bedtime.  He does report that with prolonged yard work, he may develop burning in his legs.  He can do a trial of 300 mg gabapentin before yard work, to see if this provides any benefit.  I have encouraged her to continue exercise, good management of his diabetes.  He will follow-up in 6 months or sooner if needed.  I have advised him that if his symptoms worsen or if he develops any new symptoms he should let us know.   I spent 15 minutes with the patient. 50% of this time was spent discussing his plan of care.   Margie EgeSarah Slack, AGNP-C, DNP 06/12/2019, 9:21 AM [facility] [address], [address] [place], [place] [address] [phone]

## 2019-06-12 ENCOUNTER — Other Ambulatory Visit: Payer: Self-pay

## 2019-06-12 ENCOUNTER — Ambulatory Visit: Payer: Medicare Other | Admitting: Neurology

## 2019-06-12 ENCOUNTER — Encounter: Payer: Self-pay | Admitting: Neurology

## 2019-06-12 VITALS — BP 114/69 | HR 56 | Temp 98.2°F | Ht 68.0 in | Wt 182.0 lb

## 2019-06-12 DIAGNOSIS — E1142 Type 2 diabetes mellitus with diabetic polyneuropathy: Secondary | ICD-10-CM

## 2019-06-12 MED ORDER — GABAPENTIN 300 MG PO CAPS: 600.0000 mg | ORAL_CAPSULE | Freq: Every day | ORAL | 2 refills | Status: DC

## 2019-06-12 NOTE — Patient Instructions (Signed)

## 2019-06-12 NOTE — Progress Notes (Signed)
I have read the note, and I agree with the clinical assessment and plan.  Charles K Willis   

## 2019-12-18 ENCOUNTER — Ambulatory Visit: Payer: Medicare Other | Admitting: Neurology

## 2019-12-29 ENCOUNTER — Ambulatory Visit: Payer: Medicare Other | Admitting: Neurology

## 2020-01-02 ENCOUNTER — Ambulatory Visit: Payer: Medicare PPO | Attending: Internal Medicine

## 2020-01-02 DIAGNOSIS — Z23 Encounter for immunization: Secondary | ICD-10-CM

## 2020-01-02 NOTE — Progress Notes (Signed)
   Covid-19 Vaccination Clinic  Name:  Jaevian Shean    MRN: [id number] DOB: 1946/07/21  01/02/2020  Mr. Westberg was observed post Covid-19 immunization for 15 minutes without incidence. He was provided with Vaccine Information Sheet and instruction to access the V-Safe system.   Mr. Delfavero was instructed to call 911 with any severe reactions post vaccine: Marland Kitchen Difficulty breathing  . Swelling of your face and throat  . A fast heartbeat  . A bad rash all over your body  . Dizziness and weakness    Immunizations Administered    Name Date Dose VIS Date Route   Pfizer COVID-19 Vaccine 01/02/2020  3:18 PM 0.3 mL 11/21/2019 Intramuscular   Manufacturer: [employer], [employer]   Lot: [id number]   NDC: [address]-2

## 2020-01-23 ENCOUNTER — Ambulatory Visit: Payer: Medicare PPO | Attending: Internal Medicine

## 2020-01-23 DIAGNOSIS — Z23 Encounter for immunization: Secondary | ICD-10-CM | POA: Insufficient documentation

## 2020-01-23 NOTE — Progress Notes (Signed)
   Covid-19 Vaccination Clinic  Name:  [name]    MRN: [id number] DOB: 1946/06/04  01/23/2020  [name] was observed post Covid-19 immunization for 15 minutes without incidence. He was provided with Vaccine Information Sheet and instruction to access the V-Safe system.   [name] was instructed to call 911 with any severe reactions post vaccine: Marland Kitchen Difficulty breathing  . Swelling of your face and throat  . A fast heartbeat  . A bad rash all over your body  . Dizziness and weakness    Immunizations Administered    Name Date Dose VIS Date Route   Pfizer COVID-19 Vaccine 01/23/2020  2:38 PM 0.3 mL 11/21/2019 Intramuscular   Manufacturer: [employer], [employer]   Lot: [id number]   NDC: [id number]

## 2020-02-17 ENCOUNTER — Ambulatory Visit: Payer: Medicare PPO | Admitting: Neurology

## 2020-02-17 ENCOUNTER — Encounter: Payer: Self-pay | Admitting: Neurology

## 2020-02-17 ENCOUNTER — Other Ambulatory Visit: Payer: Self-pay

## 2020-02-17 VITALS — BP 126/72 | HR 60 | Temp 98.0°F | Ht 68.0 in | Wt 182.8 lb

## 2020-02-17 DIAGNOSIS — E1142 Type 2 diabetes mellitus with diabetic polyneuropathy: Secondary | ICD-10-CM

## 2020-02-17 MED ORDER — GABAPENTIN 300 MG PO CAPS: 600.0000 mg | ORAL_CAPSULE | Freq: Every day | ORAL | 2 refills | Status: AC

## 2020-02-17 NOTE — Progress Notes (Signed)
PATIENT: [name] DOB: 02/02/1946  REASON FOR VISIT: follow up HISTORY FROM: patient  HISTORY OF PRESENT ILLNESS: Today 02/17/20  [name] is a 74 year old male with history of diabetes and peripheral neuropathy.  EMG nerve conduction study in May 2020 showed evidence of a primarily axonal peripheral neuropathy likely secondary to diabetes with mild to moderate severity.  He remains on gabapentin 600 mg at bedtime.  His symptoms are relatively mild.  He does well during the day, in the evenings, he may have some burning in his feet, but not overly significant.  Before he goes to bed, he will run cold water over his feet.  He sleeps well at night, may wake up around 5 AM, feels feet burning, pull the covers off, or use the bathroom, he is able to get back to sleep.  He remains active, works in the yard.  Recent A1c was about 6.7.  He presents today for evaluation unaccompanied.  HISTORY 06/12/2019 SS: [name] is a 74 year old male with history of diabetes and peripheral neuropathy.  She had EMG nerve conduction study in May 2020 shows evidence of a primarily axonal peripheral neuropathy likely secondary to diabetes with mild to moderate severity.  He indicates his diabetes is under good control. He remains on gabapentin 600 mg at bedtime.  He reports the gabapentin has helped the burning sensation in his feet at nighttime.  He reports during the day he may have burning in his feet with prolonged yardwork or car rides.  He is sleeping well. At night, he will soak his feet in cold water before bedtime. Otherwise, he is not having any problems.  He does report that he is exercising about 3 times a week, he has noticed that his legs get tired easier than he used to.  He used to exercise 6 days a week, did not feel that his legs tired is easy.  He presents today for follow-up unaccompanied.  REVIEW OF SYSTEMS: Out of a complete 14 system review of symptoms, the patient complains only of the  following symptoms, and all other reviewed systems are negative.  N/A  ALLERGIES: Allergies  Allergen Reactions  . Clindamycin/Lincomycin     C-Diff     HOME MEDICATIONS: Outpatient Medications Prior to Visit  Medication Sig Dispense Refill  . allopurinol (ZYLOPRIM) 100 MG tablet Take 100 mg by mouth daily.     Marland Kitchen aspirin EC 81 MG EC tablet Take 1 tablet (81 mg total) by mouth daily. 90 tablet 4  . atorvastatin (LIPITOR) 20 MG tablet Take 20 mg by mouth daily with supper.     . metFORMIN (GLUCOPHAGE-XR) 500 MG 24 hr tablet Take 1 tablet (500 mg total) by mouth 2 (two) times daily with a meal. Resume 48 hr after heart catheterization.    . nitroGLYCERIN (NITROSTAT) 0.4 MG SL tablet Place 1 tablet (0.4 mg total) under the tongue every 5 (five) minutes x 3 doses as needed for chest pain. 25 tablet 1  . gabapentin (NEURONTIN) 300 MG capsule Take 2 capsules (600 mg total) by mouth at bedtime. 180 capsule 2   Facility-Administered Medications Prior to Visit  Medication Dose Route Frequency Provider Last Rate Last Admin  . 0.9 %  sodium chloride infusion   Intravenous Continuous Jake Bathe, MD        PAST MEDICAL HISTORY: Past Medical History:  Diagnosis Date  . Clostridium difficile infection   . Coronary artery disease    CARDIOLOGIST--  dr  skains  . Diabetic peripheral neuropathy ([place]) 04/30/2019  . Gout   . History of kidney stones   . History of skin cancer    42 moles frozen off   . Left ureteral calculus   . Neuropathy   . S/P CABG x 3    04-19-2012  . Type 2 diabetes mellitus ([place])   . Urgency of urination   . Wears partial dentures    upper and lower    PAST SURGICAL HISTORY: Past Surgical History:  Procedure Laterality Date  . CARDIAC CATHETERIZATION  04/16/2012 dr Marlou Porch   severe 3 vessel CAD/  pLAD 90%,  pRCA 95%, total occluded CFX/  normal LVSF/  diastolic dysfunction  . CARDIAC CATHETERIZATION  11/06/2017  . CARDIOVASCULAR STRESS TEST  04/2012  .  COLONOSCOPY WITH PROPOFOL N/A 08/23/2017   Procedure: COLONOSCOPY WITH PROPOFOL;  Surgeon: Arta Silence, MD;  Location: [place];  Service: Endoscopy;  Laterality: N/A;  . CORONARY ARTERY BYPASS GRAFT  04/19/2012   Procedure: CORONARY ARTERY BYPASS GRAFTING (CABG);  Surgeon: Gaye Pollack, MD;  Location: [place];  Service: Open Heart Surgery;  Laterality: N/A; LIMA to LAD, SVG to CFX, SVG to PDA  . CYSTOLITHAPAXY W/ HOLMIUM LASER AND TRANSURETHRAL INCISION OF PROSTATE  10-23-2006  . CYSTOSCOPY WITH RETROGRADE PYELOGRAM, URETEROSCOPY AND STENT PLACEMENT Left 11/02/2014   Procedure: CYSTOSCOPY WITH RETROGRADE PYELOGRAM, URETEROSCOPY, EXTRACTION OF STONES AND STENT PLACEMENT;  Surgeon: Jorja Loa, MD;  Location: [facility];  Service: Urology;  Laterality: Left;  . CYSTOSCOPY WITH URETEROSCOPY, STONE BASKETRY AND STENT PLACEMENT Right 12/01/2013   Procedure: CYSTOSCOPY WITH URETEROSCOPY, RIGHT RETROGRADE PYLEGRAM ,  STONE REMOVAL,  AND STENT PLACEMENT;  Surgeon: Franchot Gallo, MD;  Location: [facility];  Service: Urology;  Laterality: Right;  . FECAL TRANSPLANT N/A 08/23/2017   Procedure: FECAL TRANSPLANT;  Surgeon: Arta Silence, MD;  Location: [place];  Service: Endoscopy;  Laterality: N/A;  . HOLMIUM LASER APPLICATION Right 11/91/4782   Procedure: HOLMIUM LASER APPLICATION;  Surgeon: Franchot Gallo, MD;  Location: [facility];  Service: Urology;  Laterality: Right;  . HOLMIUM LASER APPLICATION Left 95/62/1308   Procedure: HOLMIUM LASER APPLICATION;  Surgeon: Jorja Loa, MD;  Location: [facility];  Service: Urology;  Laterality: Left;  . INCISION AND DRAINAGE ABSCESS N/A 12/25/2016   Procedure: INCISION AND DRAINAGE SUBMENTAL ABSCESS;  Surgeon: Melida Quitter, MD;  Location: [place];  Service: ENT;  Laterality: N/A;  . KNEE ARTHROSCOPY Left 1994  . LEFT HEART CATH AND CORS/GRAFTS ANGIOGRAPHY N/A 11/06/2017   Procedure: LEFT HEART CATH AND  CORS/GRAFTS ANGIOGRAPHY;  Surgeon: Burnell Blanks, MD;  Location: [place] [facility];  Service: Cardiovascular;  Laterality: N/A;  . LEFT HEART CATHETERIZATION WITH CORONARY ANGIOGRAM N/A 04/16/2012   Procedure: LEFT HEART CATHETERIZATION WITH CORONARY ANGIOGRAM;  Surgeon: Candee Furbish, MD;  Location: [facility];  Service: Cardiovascular;  Laterality: N/A;  . SKIN CANCER EXCISION  2014  . URETEROSCOPIC STONE EXTRACTION W/ STENT PLACEMENT Bilateral bilateral 06-23-2011//  right 09-16-2007//  left 07-02-2009    FAMILY HISTORY: Family History  Problem Relation Age of Onset  . Congestive Heart Failure Mother   . Alcohol abuse Father   . Kidney Stones Other   . Heart failure Other   . Diabetes Other   . Heart attack Maternal Uncle        multiple mat. uncles in their 65's     SOCIAL HISTORY: Social History   Socioeconomic History  .  Marital status: Married    Spouse name: Dayle Almanza   . Number of children: 3  . Years of education: Not on file  . Highest education level: Master's degree (e.g., MA, MS, MEng, MEd, MSW, MBA)  Occupational History  . Occupation: Retired Runner, broadcasting/film/video   Tobacco Use  . Smoking status: Never Smoker  . Smokeless tobacco: Never Used  Substance and Sexual Activity  . Alcohol use: Yes    Comment: occasional  . Drug use: No  . Sexual activity: Never  Other Topics Concern  . Not on file  Social History Narrative   Right handed    Caffeine 4 cups daily    Lives at home with spouse    Social Determinants of Health   Financial Resource Strain:   . Difficulty of Paying Living Expenses: Not on file  Food Insecurity:   . Worried About Programme researcher, broadcasting/film/video in the Last Year: Not on file  . Ran Out of Food in the Last Year: Not on file  Transportation Needs:   . Lack of Transportation (Medical): Not on file  . Lack of Transportation (Non-Medical): Not on file  Physical Activity:   . Days of Exercise per Week: Not on file  . Minutes of Exercise per  Session: Not on file  Stress:   . Feeling of Stress : Not on file  Social Connections:   . Frequency of Communication with Friends and Family: Not on file  . Frequency of Social Gatherings with Friends and Family: Not on file  . Attends Religious Services: Not on file  . Active Member of Clubs or Organizations: Not on file  . Attends Banker Meetings: Not on file  . Marital Status: Not on file  Intimate Partner Violence:   . Fear of Current or Ex-Partner: Not on file  . Emotionally Abused: Not on file  . Physically Abused: Not on file  . Sexually Abused: Not on file   PHYSICAL EXAM  Vitals:   02/17/20 1230  BP: 126/72  Pulse: 60  Temp: 98 F (36.7 C)  TempSrc: Oral  Weight: 182 lb 12.8 oz (82.9 kg)  Height: 5\' 8"  (1.727 m)   Body mass index is 27.79 kg/m.  Generalized: Well developed, in no acute distress   Neurological examination  Mentation: Alert oriented to time, place, history taking. Follows all commands speech and language fluent Cranial nerve II-XII: Pupils were equal round reactive to light. Extraocular movements were full, visual field were full on confrontational test. Facial sensation and strength were normal. Head turning and shoulder shrug were normal and symmetric. Motor: The motor testing reveals 5 over 5 strength of all 4 extremities. Good symmetric motor tone is noted throughout.  Sensory: Sensory testing is intact to soft touch on all 4 extremities. No evidence of extinction is noted.  Coordination: Cerebellar testing reveals good finger-nose-finger and heel-to-shin bilaterally.  Gait and station: Gait is normal. Tandem gait is normal.  Able to walk on heels and tiptoe without problem. Reflexes: Deep tendon reflexes are symmetric and normal bilaterally.   DIAGNOSTIC DATA (LABS, IMAGING, TESTING) - I reviewed patient records, labs, notes, testing and imaging myself where available.  Lab Results  Component Value Date   WBC 6.9 11/07/2017    HGB 14.1 11/07/2017   HCT 42.3 11/07/2017   MCV 96.6 11/07/2017   PLT 164 11/07/2017      Component Value Date/Time   NA 140 11/07/2017 0334   K 4.6 11/07/2017 0334  CL 107 11/07/2017 0334   CO2 28 11/07/2017 0334   GLUCOSE 97 11/07/2017 0334   BUN 19 11/07/2017 0334   CREATININE 1.22 11/07/2017 0334   CALCIUM 9.2 11/07/2017 0334   PROT 6.5 02/05/2019 0849   ALBUMIN 3.2 (L) 12/25/2016 1545   AST 27 12/25/2016 1545   ALT 18 12/25/2016 1545   ALKPHOS 52 12/25/2016 1545   BILITOT 0.8 12/25/2016 1545   GFRNONAA 59 (L) 11/07/2017 0334   GFRAA >60 11/07/2017 0334   Lab Results  Component Value Date   CHOL 98 11/07/2017   HDL 42 11/07/2017   LDLCALC 45 11/07/2017   TRIG 57 11/07/2017   CHOLHDL 2.3 11/07/2017   Lab Results  Component Value Date   HGBA1C 7.0 (H) 04/17/2012   No results found for: VITAMINB12 No results found for: TSH    ASSESSMENT AND PLAN 74 y.o. year old male  has a past medical history of Clostridium difficile infection, Coronary artery disease, Diabetic peripheral neuropathy ([facility]) (04/30/2019), Gout, History of kidney stones, History of skin cancer, Left ureteral calculus, Neuropathy, S/P CABG x 3, Type 2 diabetes mellitus (H[facility]), Urgency of urination, and Wears partial dentures. here with:  1.  Peripheral neuropathy  He has continued to do well, fortunately his symptoms are relatively mild.  He will remain on gabapentin 600 mg at bedtime.  I provided a refill.  He will follow-up at our office on an as-needed basis, his primary can refill subsequent gabapentin prescriptions.   I spent 15 minutes with the patient. 50% of this time was spent discussing his plan of care.   Margie Ege, AGNP-C, DNP 02/17/2020, 1:07 PM [facility] [address], [address] [place], [place] [address] [phone]

## 2020-02-17 NOTE — Patient Instructions (Signed)
It was good to see you today :) Continue taking gabapentin  Follow-up with your primary doctor, see [place] as needed

## 2020-02-19 NOTE — Progress Notes (Signed)
I have read the note, and I agree with the clinical assessment and plan.  Neera Teng K Arianis Bowditch   

## 2020-10-11 DIAGNOSIS — N202 Calculus of kidney with calculus of ureter: Secondary | ICD-10-CM | POA: Diagnosis not present

## 2020-11-10 DIAGNOSIS — N200 Calculus of kidney: Secondary | ICD-10-CM | POA: Diagnosis not present

## 2020-11-10 DIAGNOSIS — R311 Benign essential microscopic hematuria: Secondary | ICD-10-CM | POA: Diagnosis not present

## 2020-12-17 DIAGNOSIS — L570 Actinic keratosis: Secondary | ICD-10-CM | POA: Diagnosis not present

## 2020-12-17 DIAGNOSIS — D0439 Carcinoma in situ of skin of other parts of face: Secondary | ICD-10-CM | POA: Diagnosis not present

## 2020-12-17 DIAGNOSIS — D044 Carcinoma in situ of skin of scalp and neck: Secondary | ICD-10-CM | POA: Diagnosis not present

## 2020-12-17 DIAGNOSIS — X32XXXD Exposure to sunlight, subsequent encounter: Secondary | ICD-10-CM | POA: Diagnosis not present

## 2021-01-21 DIAGNOSIS — D0422 Carcinoma in situ of skin of left ear and external auricular canal: Secondary | ICD-10-CM | POA: Diagnosis not present

## 2021-01-21 DIAGNOSIS — D044 Carcinoma in situ of skin of scalp and neck: Secondary | ICD-10-CM | POA: Diagnosis not present

## 2021-01-31 DIAGNOSIS — H6012 Cellulitis of left external ear: Secondary | ICD-10-CM | POA: Diagnosis not present

## 2021-01-31 DIAGNOSIS — H60502 Unspecified acute noninfective otitis externa, left ear: Secondary | ICD-10-CM | POA: Diagnosis not present

## 2021-03-18 DIAGNOSIS — E1149 Type 2 diabetes mellitus with other diabetic neurological complication: Secondary | ICD-10-CM | POA: Diagnosis not present

## 2021-03-18 DIAGNOSIS — G479 Sleep disorder, unspecified: Secondary | ICD-10-CM | POA: Diagnosis not present

## 2021-03-18 DIAGNOSIS — G629 Polyneuropathy, unspecified: Secondary | ICD-10-CM | POA: Diagnosis not present

## 2021-03-18 DIAGNOSIS — M109 Gout, unspecified: Secondary | ICD-10-CM | POA: Diagnosis not present

## 2021-03-18 DIAGNOSIS — E7800 Pure hypercholesterolemia, unspecified: Secondary | ICD-10-CM | POA: Diagnosis not present

## 2021-03-18 DIAGNOSIS — E1169 Type 2 diabetes mellitus with other specified complication: Secondary | ICD-10-CM | POA: Diagnosis not present

## 2021-03-18 DIAGNOSIS — Z0000 Encounter for general adult medical examination without abnormal findings: Secondary | ICD-10-CM | POA: Diagnosis not present

## 2021-09-26 DIAGNOSIS — G479 Sleep disorder, unspecified: Secondary | ICD-10-CM | POA: Diagnosis not present

## 2021-09-26 DIAGNOSIS — E1149 Type 2 diabetes mellitus with other diabetic neurological complication: Secondary | ICD-10-CM | POA: Diagnosis not present

## 2022-03-23 DIAGNOSIS — M109 Gout, unspecified: Secondary | ICD-10-CM | POA: Diagnosis not present

## 2022-03-23 DIAGNOSIS — E7800 Pure hypercholesterolemia, unspecified: Secondary | ICD-10-CM | POA: Diagnosis not present

## 2022-03-23 DIAGNOSIS — E1149 Type 2 diabetes mellitus with other diabetic neurological complication: Secondary | ICD-10-CM | POA: Diagnosis not present

## 2022-03-30 DIAGNOSIS — I2510 Atherosclerotic heart disease of native coronary artery without angina pectoris: Secondary | ICD-10-CM | POA: Diagnosis not present

## 2022-03-30 DIAGNOSIS — E7800 Pure hypercholesterolemia, unspecified: Secondary | ICD-10-CM | POA: Diagnosis not present

## 2022-03-30 DIAGNOSIS — Z79899 Other long term (current) drug therapy: Secondary | ICD-10-CM | POA: Diagnosis not present

## 2022-03-30 DIAGNOSIS — M109 Gout, unspecified: Secondary | ICD-10-CM | POA: Diagnosis not present

## 2022-03-30 DIAGNOSIS — Z0000 Encounter for general adult medical examination without abnormal findings: Secondary | ICD-10-CM | POA: Diagnosis not present

## 2022-03-30 DIAGNOSIS — G629 Polyneuropathy, unspecified: Secondary | ICD-10-CM | POA: Diagnosis not present

## 2022-03-30 DIAGNOSIS — E1149 Type 2 diabetes mellitus with other diabetic neurological complication: Secondary | ICD-10-CM | POA: Diagnosis not present

## 2022-09-19 DIAGNOSIS — H40033 Anatomical narrow angle, bilateral: Secondary | ICD-10-CM | POA: Diagnosis not present

## 2022-09-19 DIAGNOSIS — E119 Type 2 diabetes mellitus without complications: Secondary | ICD-10-CM | POA: Diagnosis not present

## 2022-10-13 DIAGNOSIS — Z6827 Body mass index (BMI) 27.0-27.9, adult: Secondary | ICD-10-CM | POA: Diagnosis not present

## 2022-10-13 DIAGNOSIS — R21 Rash and other nonspecific skin eruption: Secondary | ICD-10-CM | POA: Diagnosis not present

## 2022-10-13 DIAGNOSIS — E1149 Type 2 diabetes mellitus with other diabetic neurological complication: Secondary | ICD-10-CM | POA: Diagnosis not present

## 2023-02-13 DIAGNOSIS — D225 Melanocytic nevi of trunk: Secondary | ICD-10-CM | POA: Diagnosis not present

## 2023-02-13 DIAGNOSIS — L720 Epidermal cyst: Secondary | ICD-10-CM | POA: Diagnosis not present

## 2023-02-13 DIAGNOSIS — D485 Neoplasm of uncertain behavior of skin: Secondary | ICD-10-CM | POA: Diagnosis not present

## 2023-03-02 DIAGNOSIS — D485 Neoplasm of uncertain behavior of skin: Secondary | ICD-10-CM | POA: Diagnosis not present

## 2023-03-02 DIAGNOSIS — D225 Melanocytic nevi of trunk: Secondary | ICD-10-CM | POA: Diagnosis not present

## 2023-03-02 DIAGNOSIS — L255 Unspecified contact dermatitis due to plants, except food: Secondary | ICD-10-CM | POA: Diagnosis not present

## 2023-06-18 DIAGNOSIS — M109 Gout, unspecified: Secondary | ICD-10-CM | POA: Diagnosis not present

## 2023-06-18 DIAGNOSIS — E1149 Type 2 diabetes mellitus with other diabetic neurological complication: Secondary | ICD-10-CM | POA: Diagnosis not present

## 2023-06-18 DIAGNOSIS — Z79899 Other long term (current) drug therapy: Secondary | ICD-10-CM | POA: Diagnosis not present

## 2023-06-18 DIAGNOSIS — E7800 Pure hypercholesterolemia, unspecified: Secondary | ICD-10-CM | POA: Diagnosis not present

## 2023-06-25 DIAGNOSIS — M109 Gout, unspecified: Secondary | ICD-10-CM | POA: Diagnosis not present

## 2023-06-25 DIAGNOSIS — E7800 Pure hypercholesterolemia, unspecified: Secondary | ICD-10-CM | POA: Diagnosis not present

## 2023-06-25 DIAGNOSIS — G629 Polyneuropathy, unspecified: Secondary | ICD-10-CM | POA: Diagnosis not present

## 2023-06-25 DIAGNOSIS — E1142 Type 2 diabetes mellitus with diabetic polyneuropathy: Secondary | ICD-10-CM | POA: Diagnosis not present

## 2023-06-25 DIAGNOSIS — Z0000 Encounter for general adult medical examination without abnormal findings: Secondary | ICD-10-CM | POA: Diagnosis not present

## 2023-06-25 DIAGNOSIS — Z6827 Body mass index (BMI) 27.0-27.9, adult: Secondary | ICD-10-CM | POA: Diagnosis not present

## 2023-07-13 DIAGNOSIS — Z1212 Encounter for screening for malignant neoplasm of rectum: Secondary | ICD-10-CM | POA: Diagnosis not present

## 2023-07-13 DIAGNOSIS — Z1211 Encounter for screening for malignant neoplasm of colon: Secondary | ICD-10-CM | POA: Diagnosis not present

## 2024-01-09 DIAGNOSIS — R030 Elevated blood-pressure reading, without diagnosis of hypertension: Secondary | ICD-10-CM | POA: Diagnosis not present

## 2024-01-09 DIAGNOSIS — Z6827 Body mass index (BMI) 27.0-27.9, adult: Secondary | ICD-10-CM | POA: Diagnosis not present

## 2024-01-09 DIAGNOSIS — G629 Polyneuropathy, unspecified: Secondary | ICD-10-CM | POA: Diagnosis not present

## 2024-01-09 DIAGNOSIS — E1142 Type 2 diabetes mellitus with diabetic polyneuropathy: Secondary | ICD-10-CM | POA: Diagnosis not present

## 2024-02-08 DIAGNOSIS — J101 Influenza due to other identified influenza virus with other respiratory manifestations: Secondary | ICD-10-CM | POA: Diagnosis not present

## 2024-02-08 DIAGNOSIS — R509 Fever, unspecified: Secondary | ICD-10-CM | POA: Diagnosis not present

## 2024-02-08 DIAGNOSIS — J029 Acute pharyngitis, unspecified: Secondary | ICD-10-CM | POA: Diagnosis not present

## 2024-02-08 DIAGNOSIS — R52 Pain, unspecified: Secondary | ICD-10-CM | POA: Diagnosis not present

## 2024-02-08 DIAGNOSIS — R5383 Other fatigue: Secondary | ICD-10-CM | POA: Diagnosis not present

## 2024-02-08 DIAGNOSIS — R059 Cough, unspecified: Secondary | ICD-10-CM | POA: Diagnosis not present

## 2024-05-06 DIAGNOSIS — C44622 Squamous cell carcinoma of skin of right upper limb, including shoulder: Secondary | ICD-10-CM | POA: Diagnosis not present

## 2024-05-06 DIAGNOSIS — L570 Actinic keratosis: Secondary | ICD-10-CM | POA: Diagnosis not present

## 2024-05-06 DIAGNOSIS — X32XXXD Exposure to sunlight, subsequent encounter: Secondary | ICD-10-CM | POA: Diagnosis not present

## 2024-05-06 DIAGNOSIS — C44729 Squamous cell carcinoma of skin of left lower limb, including hip: Secondary | ICD-10-CM | POA: Diagnosis not present

## 2024-06-17 DIAGNOSIS — Z08 Encounter for follow-up examination after completed treatment for malignant neoplasm: Secondary | ICD-10-CM | POA: Diagnosis not present

## 2024-06-17 DIAGNOSIS — X32XXXD Exposure to sunlight, subsequent encounter: Secondary | ICD-10-CM | POA: Diagnosis not present

## 2024-06-17 DIAGNOSIS — Z85828 Personal history of other malignant neoplasm of skin: Secondary | ICD-10-CM | POA: Diagnosis not present

## 2024-06-17 DIAGNOSIS — L570 Actinic keratosis: Secondary | ICD-10-CM | POA: Diagnosis not present

## 2024-06-17 DIAGNOSIS — C44219 Basal cell carcinoma of skin of left ear and external auricular canal: Secondary | ICD-10-CM | POA: Diagnosis not present

## 2024-06-17 DIAGNOSIS — L820 Inflamed seborrheic keratosis: Secondary | ICD-10-CM | POA: Diagnosis not present

## 2024-06-28 DIAGNOSIS — H10013 Acute follicular conjunctivitis, bilateral: Secondary | ICD-10-CM | POA: Diagnosis not present

## 2024-07-08 DIAGNOSIS — G629 Polyneuropathy, unspecified: Secondary | ICD-10-CM | POA: Diagnosis not present

## 2024-07-08 DIAGNOSIS — E7800 Pure hypercholesterolemia, unspecified: Secondary | ICD-10-CM | POA: Diagnosis not present

## 2024-07-08 DIAGNOSIS — E1142 Type 2 diabetes mellitus with diabetic polyneuropathy: Secondary | ICD-10-CM | POA: Diagnosis not present

## 2024-07-08 DIAGNOSIS — Z0000 Encounter for general adult medical examination without abnormal findings: Secondary | ICD-10-CM | POA: Diagnosis not present

## 2024-07-08 DIAGNOSIS — R059 Cough, unspecified: Secondary | ICD-10-CM | POA: Diagnosis not present

## 2024-07-08 DIAGNOSIS — Z6827 Body mass index (BMI) 27.0-27.9, adult: Secondary | ICD-10-CM | POA: Diagnosis not present

## 2024-07-08 DIAGNOSIS — M109 Gout, unspecified: Secondary | ICD-10-CM | POA: Diagnosis not present
